# Patient Record
Sex: Male | Born: 1955 | Race: White | Hispanic: No | Marital: Married | Smoking: Former smoker
Health system: Southern US, Community
[De-identification: ages and names within clinical notes are randomized; demographics above are authoritative.]

## PROBLEM LIST (undated history)

## (undated) DIAGNOSIS — E785 Hyperlipidemia, unspecified: Secondary | ICD-10-CM

## (undated) DIAGNOSIS — Z87898 Personal history of other specified conditions: Secondary | ICD-10-CM

## (undated) DIAGNOSIS — M1990 Unspecified osteoarthritis, unspecified site: Secondary | ICD-10-CM

## (undated) DIAGNOSIS — I10 Essential (primary) hypertension: Secondary | ICD-10-CM

## (undated) DIAGNOSIS — I480 Paroxysmal atrial fibrillation: Secondary | ICD-10-CM

## (undated) DIAGNOSIS — C801 Malignant (primary) neoplasm, unspecified: Secondary | ICD-10-CM

## (undated) HISTORY — DX: Paroxysmal atrial fibrillation: I48.0

## (undated) HISTORY — PX: TOTAL KNEE ARTHROPLASTY: SHX125

## (undated) HISTORY — DX: Unspecified osteoarthritis, unspecified site: M19.90

## (undated) HISTORY — DX: Essential (primary) hypertension: I10

## (undated) HISTORY — DX: Personal history of other specified conditions: Z87.898

## (undated) HISTORY — DX: Hyperlipidemia, unspecified: E78.5

---

## 1994-02-20 DIAGNOSIS — I480 Paroxysmal atrial fibrillation: Secondary | ICD-10-CM

## 1994-02-20 HISTORY — DX: Paroxysmal atrial fibrillation: I48.0

## 1998-05-26 ENCOUNTER — Ambulatory Visit (HOSPITAL_COMMUNITY): Admission: RE | Admit: 1998-05-26 | Discharge: 1998-05-26 | Payer: Self-pay | Admitting: Family Medicine

## 1998-05-26 ENCOUNTER — Encounter: Payer: Self-pay | Admitting: Family Medicine

## 1998-06-09 ENCOUNTER — Emergency Department (HOSPITAL_COMMUNITY): Admission: EM | Admit: 1998-06-09 | Discharge: 1998-06-09 | Payer: Self-pay | Admitting: Emergency Medicine

## 1998-06-17 ENCOUNTER — Encounter: Payer: Self-pay | Admitting: Neurosurgery

## 1998-06-18 ENCOUNTER — Encounter: Payer: Self-pay | Admitting: Neurosurgery

## 1998-06-18 ENCOUNTER — Inpatient Hospital Stay (HOSPITAL_COMMUNITY): Admission: RE | Admit: 1998-06-18 | Discharge: 1998-06-19 | Payer: Self-pay | Admitting: Neurosurgery

## 1999-06-01 ENCOUNTER — Encounter: Payer: Self-pay | Admitting: Emergency Medicine

## 1999-06-01 ENCOUNTER — Emergency Department (HOSPITAL_COMMUNITY): Admission: EM | Admit: 1999-06-01 | Discharge: 1999-06-01 | Payer: Self-pay | Admitting: Emergency Medicine

## 2005-09-18 ENCOUNTER — Ambulatory Visit (HOSPITAL_COMMUNITY): Admission: RE | Admit: 2005-09-18 | Discharge: 2005-09-18 | Payer: Self-pay | Admitting: Orthopedic Surgery

## 2005-10-31 ENCOUNTER — Ambulatory Visit (HOSPITAL_COMMUNITY): Admission: RE | Admit: 2005-10-31 | Discharge: 2005-10-31 | Payer: Self-pay | Admitting: Orthopedic Surgery

## 2011-02-10 ENCOUNTER — Ambulatory Visit (INDEPENDENT_AMBULATORY_CARE_PROVIDER_SITE_OTHER): Payer: BC Managed Care – PPO

## 2011-02-10 DIAGNOSIS — J111 Influenza due to unidentified influenza virus with other respiratory manifestations: Secondary | ICD-10-CM

## 2011-02-10 DIAGNOSIS — M109 Gout, unspecified: Secondary | ICD-10-CM

## 2011-02-10 DIAGNOSIS — M25579 Pain in unspecified ankle and joints of unspecified foot: Secondary | ICD-10-CM

## 2012-05-01 ENCOUNTER — Ambulatory Visit: Payer: BC Managed Care – PPO

## 2012-05-01 ENCOUNTER — Ambulatory Visit (INDEPENDENT_AMBULATORY_CARE_PROVIDER_SITE_OTHER): Payer: BC Managed Care – PPO | Admitting: Family Medicine

## 2012-05-01 VITALS — BP 142/90 | HR 70 | Temp 98.1°F | Resp 18 | Ht 71.0 in | Wt 211.6 lb

## 2012-05-01 DIAGNOSIS — M25561 Pain in right knee: Secondary | ICD-10-CM

## 2012-05-01 DIAGNOSIS — I10 Essential (primary) hypertension: Secondary | ICD-10-CM | POA: Insufficient documentation

## 2012-05-01 DIAGNOSIS — M1710 Unilateral primary osteoarthritis, unspecified knee: Secondary | ICD-10-CM

## 2012-05-01 DIAGNOSIS — M25569 Pain in unspecified knee: Secondary | ICD-10-CM

## 2012-05-01 DIAGNOSIS — M1711 Unilateral primary osteoarthritis, right knee: Secondary | ICD-10-CM | POA: Insufficient documentation

## 2012-05-01 MED ORDER — OXYCODONE-ACETAMINOPHEN 5-325 MG PO TABS: 1.0000 | ORAL_TABLET | Freq: Three times a day (TID) | ORAL | Status: DC | PRN

## 2012-05-01 NOTE — Patient Instructions (Signed)
We are going to refer you to have another opinion about your knee.  In the meantime continue to apply ice and elevate the leg when you are more swollen.  You may use the percocet as needed, but remember it can cause sedation and be habit forming.

## 2012-05-01 NOTE — Progress Notes (Signed)
[facility] [address], [place] [address] [id number] [phone]  Date:  05/01/2012   Name:  Wayne Neal   DOB:  28-Jun-1955   MRN:  [id number]  PCP:  No primary provider on file.    Chief Complaint: Knee Pain   History of Present Illness:  Wayne Neal is a 57 y.o. very pleasant male patient who presents with the following:  He is here with knee pain today- his right knee has bothered him for about 5 years.  There was no particular injury, he has been told "there is no cartilage in the medial knee."  He has seen ortho and had injections of steroids 3 or 4 times- more recently this has not been very helpful.  His knee was last evaluated about 8- 12 months ago.  He had a knee scope several years ago as well.  He was on his knee a lot over the last week or so which made his knee worse.    Montee would like to pursue possible knee replacement- he has been told in the past he was too young for this procedure, but he is frustrated by his continued pain and would like to revisit this issue  He is "eating up" ibuprofen but it does not seem to help much.  He also has some tramadol at home but it "does not work worth a shit" so he does not take it.  Percocet has been helpful for him in the past  There is no problem list on file for this patient.   Past Medical History  Diagnosis Date  . Arthritis     History reviewed. No pertinent past surgical history.  History  Substance Use Topics  . Smoking status: Never Smoker   . Smokeless tobacco: Not on file  . Alcohol Use: .5 - 1 oz/week    1-2 drink(s) per week    History reviewed. No pertinent family history.  No Known Allergies  Medication list has been reviewed and updated.  No current outpatient prescriptions on file prior to visit.   No current facility-administered medications on file prior to visit.    Review of Systems:  As per HPI- otherwise negative.   Physical Examination: Filed Vitals:   05/01/12 0914  BP: 142/90  Pulse: 70  Temp: 98.1 F (36.7 C)  Resp: 18   Filed Vitals:   05/01/12 0914  Height: 5\' 11"  (1.803 m)  Weight: 211 lb 9.6 oz (95.981 kg)   Body mass index is 29.53 kg/(m^2). Ideal Body Weight: Weight in (lb) to have BMI = 25: 178.9  GEN: WDWN, NAD, Non-toxic, A & O x 3 HEENT: Atraumatic, Normocephalic. Neck supple. No masses, No LAD. Ears and Nose: No external deformity. CV: RRR, No M/G/R. No JVD. No thrill. No extra heart sounds. PULM: CTA B, no wheezes, crackles, rhonchi. No retractions. No resp. distress. No accessory muscle use.Marland Kitchen EXTR: No c/c/e NEURO stiff and favors right leg, gets better as he "warms up" with continued walking PSYCH: Normally interactive. Conversant. Not depressed or anxious appearing.  Calm demeanor.  right knee: swelling of the joint with a small effusion. Some medial laxity.  No heat, redness, or loss of flexion/ extension.  Knee is visibly swollen and appears consistent with chronic OA.  No suggestion of infection or gout  [facility] reading (PRIMARY) by  Dr. Patsy Lager. Right knee: moderate degenerative change, mostly sub- patellar.    Assessment and Plan: Pain in right knee - Plan: DG  Knee Complete 4 Views Right, oxyCODONE-acetaminophen (PERCOCET/ROXICET) 5-325 MG per tablet, Ambulatory referral to Orthopedic Surgery  Osteoarthritis of right knee - Plan: Ambulatory referral to Orthopedic Surgery  HTN (hypertension)  Persistent pain and OA in the right knee.  Did give him #30 percocet but warned about possible SE and dependence issues.  He has a knee sleeve that is somewhat helpful, and plans to get a cane.  Refer to ortho for a second opinion- he is aware that we cannot promise that a replacement will be appropriate for him, but he can certainly seek another opinion.  He will let us know if any problems in the meantime.    Abbe Amsterdam, MD

## 2012-06-17 ENCOUNTER — Telehealth: Payer: Self-pay

## 2012-06-17 NOTE — Telephone Encounter (Signed)
Patient is having issues with his insurance company getting his mri approved with insurance, he would like his pain medication refilled until her can get his mri approved if possible [phone]

## 2012-06-17 NOTE — Telephone Encounter (Signed)
Pt went to Dr. Daphine Deutscher at [facility]. He was sent for an MRI and is having trouble with his insurance covering it. Wants pain medication until he is able to have it done. Advised pt to call Dr. Ermalene Searing office for his treatment as they are handling his knee pain now. Pt understands.

## 2012-10-22 ENCOUNTER — Other Ambulatory Visit: Payer: Self-pay | Admitting: *Deleted

## 2012-10-22 MED ORDER — AMLODIPINE-OLMESARTAN 10-40 MG PO TABS: 1.0000 | ORAL_TABLET | Freq: Every day | ORAL | Status: DC

## 2012-10-22 NOTE — Telephone Encounter (Signed)
Rx was sent to pharmacy electronically. 

## 2012-10-29 ENCOUNTER — Other Ambulatory Visit: Payer: Self-pay | Admitting: Internal Medicine

## 2012-10-30 ENCOUNTER — Other Ambulatory Visit: Payer: Self-pay | Admitting: *Deleted

## 2012-10-30 ENCOUNTER — Ambulatory Visit (INDEPENDENT_AMBULATORY_CARE_PROVIDER_SITE_OTHER): Payer: BC Managed Care – PPO | Admitting: Internal Medicine

## 2012-10-30 ENCOUNTER — Encounter: Payer: Self-pay | Admitting: Internal Medicine

## 2012-10-30 VITALS — BP 160/90 | HR 94 | Ht 71.0 in | Wt 203.9 lb

## 2012-10-30 DIAGNOSIS — I480 Paroxysmal atrial fibrillation: Secondary | ICD-10-CM

## 2012-10-30 DIAGNOSIS — I4891 Unspecified atrial fibrillation: Secondary | ICD-10-CM

## 2012-10-30 DIAGNOSIS — E785 Hyperlipidemia, unspecified: Secondary | ICD-10-CM

## 2012-10-30 MED ORDER — METOPROLOL TARTRATE 25 MG PO TABS: 25.0000 mg | ORAL_TABLET | Freq: Every morning | ORAL | Status: DC

## 2012-10-30 MED ORDER — AMLODIPINE-OLMESARTAN 5-20 MG PO TABS: 1.0000 | ORAL_TABLET | Freq: Every day | ORAL | Status: DC

## 2012-10-30 NOTE — Patient Instructions (Addendum)
Your physician recommends that you return for lab work in 1-2 weeks. You will need to be fasting.  NMR with lipid  Your physician has recommended you make the following change in your medication: Take Azor 5-20 daily.  Your physician wants you to follow-up in: 1 year. You will receive a reminder letter in the mail two months in advance. If you don't receive a letter, please call our office to schedule the follow-up appointment.

## 2012-10-30 NOTE — Progress Notes (Signed)
OFFICE NOTE  Chief Complaint:  Routine office visit  Primary Care Physician: Abbe Amsterdam, MD  HPI:  Wayne Neal is a pleasant 57 year old gentleman with a history of one episode of atrial fibrillation in 1996. This spontaneously converted back to sinus and has not been on any anticoagulation since then. He's been on metoprolol which seems to keep him rate controlled and he has very infrequent palpitations. He has had problems with hypertension and has been taking Azor 10/40 mg and metoprolol. Recently he's had about 10 or 15 pounds of weight loss and after his surgery has had lower blood pressures. He attributes the lower blood pressures to weight loss however this may be do to the fact that he is no longer in chronic pain. Either way his blood pressures were lower recently in the 80s or 90s systolic. He has cut his Azor 10/40 in half and he feels less symptomatic with this. He denies any chest pain or shortness of breath with exertion. He does not have an active primary care provider has not had a test of his cholesterol in some time.  He recently had right total knee replacement at [facility] and is recovering from that.  PMHx:  Past Medical History  Diagnosis Date  . Arthritis   . Hypertension   . History of palpitations   . PAF (paroxysmal atrial fibrillation) 1996  . Hyperlipidemia     Past Surgical History  Procedure Laterality Date  . Total knee arthroplasty      right - 08/2012    FAMHx:  Family History  Problem Relation Age of Onset  . Hyperlipidemia Mother   . Heart disease Father     CABG  . Hyperlipidemia Sister     SOCHx:   reports that he quit smoking about 30 years ago. His smokeless tobacco use includes Snuff. He reports that he drinks about 3.5 ounces of alcohol per week. He reports that he does not use illicit drugs.  ALLERGIES:  Allergies  Allergen Reactions  . Vytorin [Ezetimibe-Simvastatin]     myalgias    ROS: A comprehensive  review of systems was negative except for: Musculoskeletal: positive for right TKR  HOME MEDS: Current Outpatient Prescriptions  Medication Sig Dispense Refill  . COLCRYS 0.6 MG tablet Take 0.6 mg by mouth daily as needed.      Marland Kitchen HYDROcodone-acetaminophen (NORCO) 10-325 MG per tablet as needed.      . metoprolol tartrate (LOPRESSOR) 25 MG tablet Take 1 tablet (25 mg total) by mouth every morning.  90 tablet  3  . amLODipine-olmesartan (AZOR) 5-20 MG per tablet Take 1 tablet by mouth daily.  90 tablet  3   No current facility-administered medications for this visit.    LABS/IMAGING: No results found for this or any previous visit (from the past 48 hour(s)). No results found.  VITALS: BP 160/90  Pulse 94  Ht 5\' 11"  (1.803 m)  Wt 203 lb 14.4 oz (92.488 kg)  BMI 28.45 kg/m2  EXAM: General appearance: alert and no distress Neck: no adenopathy, no carotid bruit, no JVD, supple, symmetrical, trachea midline and thyroid not enlarged, symmetric, no tenderness/mass/nodules Lungs: clear to auscultation bilaterally Heart: regular rate and rhythm, S1, S2 normal, no murmur, click, rub or gallop Abdomen: soft, non-tender; bowel sounds normal; no masses,  no organomegaly Extremities: extremities normal, atraumatic, no cyanosis or edema, the right knee is slightly warm, healing midline incision, mild effusion Pulses: 2+ and symmetric Skin: Skin color, texture, turgor normal. No  rashes or lesions Neurologic: Grossly normal  EKG: Sinus rhythm at 94  ASSESSMENT: 1. Hypertension-with recent episodes of hypotension 2. Remote solitary atrial fibrillation episode 3. Dyslipidemia-intolerant to statins, taking Cholestoff  PLAN: 1.   Mr. Archila is doing well after his right total knee replacement. He tolerated surgery without any difficulty. He's really had lower blood pressures, and this may be do to improvement in his pain. It may also have to do with some weight loss. I agree with decreasing his  Azor to 5/20 mg daily and have given him a new prescription for this. His blood pressure slightly higher today because he ran out of his Lopressor and we'll go ahead and refill that at the same dose. Finally, he is due for a recheck of his lipid profile. He's been intolerant to Crestor, Vytorin, and pravastatin in the past. He's currently taking Cholestoff OTC. Plan repeat office visit in one year.  Chrystie Nose, MD, [facility] Attending Cardiologist [facility]  Graison Leinberger C 10/30/2012, 8:47 AM

## 2012-10-31 LAB — NMR LIPOPROFILE WITH LIPIDS
HDL Particle Number: 36.9 umol/L (ref >=30.5)
HDL-C: 54 mg/dL (ref >=40)
LDL Size: 20.3 nm — ABNORMAL LOW (ref >20.5)
Large HDL-P: 6.1 umol/L (ref >=4.8)
Large VLDL-P: 4.7 nmol/L — ABNORMAL HIGH (ref <=2.7)

## 2012-11-07 ENCOUNTER — Telehealth: Payer: Self-pay | Admitting: *Deleted

## 2012-11-07 NOTE — Telephone Encounter (Signed)
Left VM for patient to call about lab results

## 2012-11-07 NOTE — Telephone Encounter (Signed)
Message copied by Lindell Spar on Thu Nov 07, 2012  3:00 PM ------      Message from: Chrystie Nose      Created: Mon Nov 04, 2012  8:59 AM       Please notify patient that his cholesterol is too high.  I'm not sure his over the counter medication is helping much. Would he consider a trial of another statin medication? Perhaps atorvastatin 20 mg QHS? I know he has been intolerant to other statins in the past.            -Dr. Rennis Golden       ------

## 2012-11-11 ENCOUNTER — Encounter: Payer: Self-pay | Admitting: *Deleted

## 2012-11-14 ENCOUNTER — Encounter: Payer: Self-pay | Admitting: *Deleted

## 2012-12-02 ENCOUNTER — Telehealth: Payer: Self-pay | Admitting: Internal Medicine

## 2012-12-02 MED ORDER — ATORVASTATIN CALCIUM 20 MG PO TABS: 20.0000 mg | ORAL_TABLET | Freq: Every day | ORAL | Status: DC

## 2012-12-02 NOTE — Telephone Encounter (Signed)
Forwarded to jenna 

## 2012-12-02 NOTE — Telephone Encounter (Signed)
Rx was sent to pharmacy electronically. 

## 2012-12-02 NOTE — Telephone Encounter (Signed)
Received a letter from [place] saying that we had sent in his prescription for Atorvastatin 20 mg had been send to [facility] on [address]. He just left there,it was not there.

## 2012-12-02 NOTE — Telephone Encounter (Signed)
Medication sent to pharmacy  

## 2012-12-12 ENCOUNTER — Ambulatory Visit (INDEPENDENT_AMBULATORY_CARE_PROVIDER_SITE_OTHER): Payer: BC Managed Care – PPO | Admitting: Family Medicine

## 2012-12-12 VITALS — BP 142/90 | HR 82 | Temp 98.2°F | Resp 16 | Ht 71.5 in | Wt 211.2 lb

## 2012-12-12 DIAGNOSIS — M7020 Olecranon bursitis, unspecified elbow: Secondary | ICD-10-CM

## 2012-12-12 DIAGNOSIS — M7022 Olecranon bursitis, left elbow: Secondary | ICD-10-CM

## 2012-12-12 DIAGNOSIS — M109 Gout, unspecified: Secondary | ICD-10-CM

## 2012-12-12 LAB — POCT CBC
Lymph, poc: 2 (ref 0.6–3.4)
MCH, POC: 28.7 pg (ref 27–31.2)
MCHC: 30.6 g/dL — AB (ref 31.8–35.4)
MID (cbc): 0.6 (ref 0–0.9)
MPV: 9.7 fL (ref 0–99.8)
POC MID %: 6.8 %M (ref 0–12)
Platelet Count, POC: 208 10*3/uL (ref 142–424)
WBC: 9.4 10*3/uL (ref 4.6–10.2)

## 2012-12-12 LAB — SYNOVIAL CELL COUNT + DIFF, W/ CRYSTALS
Crystals, Fluid: NONE SEEN
Eosinophils-Synovial: 0 % (ref 0–1)
Lymphocytes-Synovial Fld: 54 % — ABNORMAL HIGH (ref 0–20)
Monocyte/Macrophage: 12 % — ABNORMAL LOW (ref 50–90)

## 2012-12-12 MED ORDER — COLCHICINE 0.6 MG PO TABS: ORAL_TABLET | ORAL | Status: DC

## 2012-12-12 MED ORDER — HYDROCODONE-ACETAMINOPHEN 10-325 MG PO TABS: 1.0000 | ORAL_TABLET | ORAL | Status: DC | PRN

## 2012-12-12 NOTE — Patient Instructions (Signed)
Wrap the elbow so that it is padded and the fluid does not re-accumulate.  Try to avoid direct pressure to the elbow or blows to the elbow.  Ice frequently - 20 minutes 4 times a day for the next week.  Olecranon Bursitis  with Rehab A bursa is a fluid filled sac that is located between soft tissues (ligaments, tendons, skin) and bones. The purpose of a bursa is to allow the soft tissue to function smoothly, without friction. The olecrenon bursa is located between the back of the elbow (olecrenon) and the skin. Olecrenon bursitis involves inflammation of this bursa, resulting in pain. SYMPTOMS   Pain, tenderness, swelling, warmth, or redness over the back of the elbow.  Reduced range of motion of the affected elbow.  Sometimes, severe pain with movement of the affected elbow.  Crackling sound (crepitation) when the bursa is moved or touched.  Often, painless swelling of the bursa.  Fever (when infected). CAUSES  Olecranon bursitis is often caused by direct hit (trauma) to the elbow. Less commonly, it is due to overuse and/or strenuous exercise that the elbow is not used to. RISK INCREASES WITH:  Sports that require bending or landing on the elbow (football, volleyball).  Vigorous or repetitive athletic training, or sudden increase or change in activity level (weekend warriors).  Failure to warm up properly before activity.  Poor exercise technique.  Playing on artificial turf. PREVENTION  Avoid injuries and the overuse of muscles whenever possible.  Warm up and stretch properly before activity.  Allow for adequate recovery between workouts.  Maintain physical fitness:  Strength, flexibility, and endurance.  Cardiovascular fitness.  Learn and use proper technique.  Wear properly fitted and padded protective equipment. PROGNOSIS  If treated properly, olecranon bursitis is usually curable within 2 weeks.  RELATED COMPLICATIONS   Longer healing time, if not properly  treated or if not given enough time to heal.  Recurring symptoms that result in a chronic problem.  Joint stiffness with permanent limitation of the affected joint's movement.  Infection of the bursa.  Chronic inflammation or scarring of the bursa. TREATMENT Treatment first involves the use of ice and medicine, to reduce pain and inflammation. The use of strengthening and stretching exercises may help reduce pain with activity. These exercises may be performed at home or with a therapist. Elbow pads may be advised, to protect the bursa. If symptoms persist, despite non-surgical treatment, a procedure to withdraw fluid from the bursa may be advised. This procedure may be accompanied with an injection of corticosteroids, to reduce inflammation. Sometimes, surgery is needed to remove the bursa. MEDICATION  If pain medicine is needed, nonsteroidal anti-inflammatory medicines (aspirin and ibuprofen), or other minor pain relievers (acetaminophen), are often advised.  Do not take pain medicine for 7 days before surgery.  Prescription pain relievers may be given, if your caregiver thinks they are needed. Use only as directed and only as much as you need.  Corticosteroid injections may be given by your caregiver. These injections should be reserved for the most serious cases, because they may only be given a certain number of times. HEAT AND COLD  Cold treatment (icing) should be applied for 10 to 15 minutes every 2 to 3 hours for inflammation and pain, and immediately after activity that aggravates your symptoms. Use ice packs or an ice massage.  Heat treatment may be used before performing stretching and strengthening activities prescribed by your caregiver, physical therapist, or athletic trainer. Use a heat pack or  a warm water soak. SEEK IMMEDIATE MEDICAL CARE IF:   Symptoms get worse or do not improve in 2 weeks, despite treatment.  Signs of infection develop, including fever of 102 F  (38.9 C), increased pain, redness, warmth, or pus draining from the bursa.  New, unexplained symptoms develop. (Drugs used in treatment may produce side effects.) EXERCISES  RANGE OF MOTION (ROM) AND STRETCHING EXERCISES - Olecranon Bursitis These exercises may help you when beginning to rehabilitate your injury. Your symptoms may resolve with or without further involvement from your physician, physical therapist or athletic trainer. While completing these exercises, remember:   Restoring tissue flexibility helps normal motion to return to the joints. This allows healthier, less painful movement and activity.  An effective stretch should be held for at least 30 seconds.  A stretch should never be painful. You should only feel a gentle lengthening or release in the stretched tissue. RANGE OF MOTION  Elbow Flexion, Supine  Lie on your back. Extend your right / left arm into the air, bracing it with your opposite hand. Allow your right / left arm to relax.  Let your elbow bend, allowing your hand to fall slowly toward your chest.  You should feel a gentle stretch along the back of your upper arm and elbow. Your physician, physical therapist or athletic trainer may ask you to hold a __________ hand weight to increase the intensity of this stretch.  Hold for __________ seconds. Slowly return your right / left arm to the upright position. Repeat __________ times. Complete this exercise __________ times per day. STRETCH  Elbow Flexors  Lie on a firm bed or countertop on your back. Be sure that you are in a comfortable position which will allow you to relax your arm muscles.  Place a folded towel under your right / left upper arm, so that your elbow and shoulder are at the same height. Extend your arm; your elbow should not rest on the bed or towel  Allow the weight of your hand to straighten your elbow. Keep your arm and chest muscles relaxed. Your caregiver may ask you to increase the  intensity of your stretch by adding a small wrist or hand weight.  Hold for __________ seconds. You should feel a stretch on the inside of your elbow. Slowly return to the starting position. Repeat __________ times. Complete this exercise __________ times per day. STRENGTHENING EXERCISES - Olecranon Bursitis These exercises will help you regain your strength. They may resolve your symptoms with or without further involvement from your physician, physical therapist or athletic trainer. While completing these exercises, remember:   Muscles can gain both the endurance and the strength needed for everyday activities through controlled exercises.  Complete these exercises as instructed by your physician, physical therapist or athletic trainer. Increase the resistance and repetitions only as guided by your caregiver.  You may experience muscle soreness or fatigue, but the pain or discomfort you are trying to eliminate should never worsen during these exercises. If this pain does worsen, stop and make certain you are following the directions exactly. If the pain is still present after adjustments, discontinue the exercise until you can discuss the trouble with your caregiver. STRENGTH - Elbow Extensors, Isometric  Stand or sit upright on a firm surface. Place your right / left arm so that your palm faces your stomach, and it is at the height of your waist.  Place your opposite hand on the underside of your forearm. Gently push up as  your right / left arm resists. Push as hard as you can with both arms without causing any pain or movement at your right / left elbow. Hold this stationary position for __________ seconds.  Gradually release the tension in both arms. Allow your muscles to relax completely before repeating. Repeat __________ times. Complete this exercise __________ times per day. STRENGTH - Elbow Flexors, Isometric  Stand or sit upright on a firm surface. Place your right / left arm so that  your hand is palm-up and at the height of your waist.  Place your opposite hand on top of your forearm. Gently push down as your right / left arm resists. Push as hard as you can with both arms without causing any pain or movement at your right / left elbow. Hold this stationary position for __________ seconds.  Gradually release the tension in both arms. Allow your muscles to relax completely before repeating. Repeat __________ times. Complete this exercise __________ times per day. STRENGTH  Elbow Flexors, Supinated  With good posture, stand or sit on a firm chair without armrests. Allow your right / left arm to rest at your side with your palm facing forward.  Holding a __________ weight, or gripping a rubber exercise band or tubing,  bring your hand toward your shoulder.  Allow your muscles to control the resistance as your hand returns to your side. Repeat __________ times. Complete this exercise __________ times per day.  STRENGTH  Elbow Flexors, Neutral  With good posture, stand or sit on a firm chair without armrests. Allow your right / left arm to rest at your side with your thumb facing forward.  Holding a __________weight, or gripping a rubber exercise band or tubing,  bring your hand toward your shoulder.  Allow your muscles to control the resistance as your hand returns to your side. Repeat __________ times. Complete this exercise __________ times per day.  STRENGTH  Elbow Extensors  Lie on your back. Extend your right / left elbow into the air, pointing it toward the ceiling. Brace your arm with your opposite hand.*  Holding a __________ weight in your hand, slowly straighten your right / left elbow.  Allow your muscles to control the weight as your hand returns to its starting position. Repeat __________ times. Complete this exercise __________ times per day. *You may also stand with your elbow overhead and pointed toward the ceiling, supported by your opposite  hand. STRENGTH - Elbow Extensors, Dynamic  With good posture, stand, or sit on a firm chair without armrests. Keeping your upper arms at your side, bring both hands up to your right / left shoulder while gripping a rubber exercise band or tubing. Your right / left hand should be just below the other hand.  Straighten your right / left elbow. Hold for __________ seconds.  Allow your muscles to control the rubber exercise band, as your hand returns to your shoulder. Repeat __________ times. Complete this exercise __________ times per day. Document Released: 02/06/2005 Document Revised: 05/01/2011 Document Reviewed: 05/21/2008 [facility] Patient Information 2014 [place], [place].  Gout Gout is an inflammatory condition (arthritis) caused by a buildup of uric acid crystals in the joints. Uric acid is a chemical that is normally present in the blood. Under some circumstances, uric acid can form into crystals in your joints. This causes joint redness, soreness, and swelling (inflammation). Repeat attacks are common. Over time, uric acid crystals can form into masses (tophi) near a joint, causing disfigurement. Gout is treatable and often  preventable. CAUSES  The disease begins with elevated levels of uric acid in the blood. Uric acid is produced by your body when it breaks down a naturally found substance called purines. This also happens when you eat certain foods such as meats and fish. Causes of an elevated uric acid level include:  Being passed down from parent to child (heredity).  Diseases that cause increased uric acid production (obesity, psoriasis, some cancers).  Excessive alcohol use.  Diet, especially diets rich in meat and seafood.  Medicines, including certain cancer-fighting drugs (chemotherapy), diuretics, and aspirin.  Chronic kidney disease. The kidneys are no longer able to remove uric acid well.  Problems with metabolism. Conditions strongly associated with gout  include:  Obesity.  High blood pressure.  High cholesterol.  Diabetes. Not everyone with elevated uric acid levels gets gout. It is not understood why some people get gout and others do not. Surgery, joint injury, and eating too much of certain foods are some of the factors that can lead to gout. SYMPTOMS   An attack of gout comes on quickly. It causes intense pain with redness, swelling, and warmth in a joint.  Fever can occur.  Often, only one joint is involved. Certain joints are more commonly involved:  Base of the big toe.  Knee.  Ankle.  Wrist.  Finger. Without treatment, an attack usually goes away in a few days to weeks. Between attacks, you usually will not have symptoms, which is different from many other forms of arthritis. DIAGNOSIS  Your caregiver will suspect gout based on your symptoms and exam. Removal of fluid from the joint (arthrocentesis) is done to check for uric acid crystals. Your caregiver will give you a medicine that numbs the area (local anesthetic) and use a needle to remove joint fluid for exam. Gout is confirmed when uric acid crystals are seen in joint fluid, using a special microscope. Sometimes, blood, urine, and X-ray tests are also used. TREATMENT  There are 2 phases to gout treatment: treating the sudden onset (acute) attack and preventing attacks (prophylaxis). Treatment of an Acute Attack  Medicines are used. These include anti-inflammatory medicines or steroid medicines.  An injection of steroid medicine into the affected joint is sometimes necessary.  The painful joint is rested. Movement can worsen the arthritis.  You may use warm or cold treatments on painful joints, depending which works best for you.  Discuss the use of coffee, vitamin C, or cherries with your caregiver. These may be helpful treatment options. Treatment to Prevent Attacks After the acute attack subsides, your caregiver may advise prophylactic medicine. These  medicines either help your kidneys eliminate uric acid from your body or decrease your uric acid production. You may need to stay on these medicines for a very long time. The early phase of treatment with prophylactic medicine can be associated with an increase in acute gout attacks. For this reason, during the first few months of treatment, your caregiver may also advise you to take medicines usually used for acute gout treatment. Be sure you understand your caregiver's directions. You should also discuss dietary treatment with your caregiver. Certain foods such as meats and fish can increase uric acid levels. Other foods such as dairy can decrease levels. Your caregiver can give you a list of foods to avoid. HOME CARE INSTRUCTIONS   Do not take aspirin to relieve pain. This raises uric acid levels.  Only take over-the-counter or prescription medicines for pain, discomfort, or fever as directed by your  caregiver.  Rest the joint as much as possible. When in bed, keep sheets and blankets off painful areas.  Keep the affected joint raised (elevated).  Use crutches if the painful joint is in your leg.  Drink enough water and fluids to keep your urine clear or pale yellow. This helps your body get rid of uric acid. Do not drink alcoholic beverages. They slow the passage of uric acid.  Follow your caregiver's dietary instructions. Pay careful attention to the amount of protein you eat. Your daily diet should emphasize fruits, vegetables, whole grains, and fat-free or low-fat milk products.  Maintain a healthy body weight. SEEK MEDICAL CARE IF:   You have an oral temperature above 102 F (38.9 C).  You develop diarrhea, vomiting, or any side effects from medicines.  You do not feel better in 24 hours, or you are getting worse. SEEK IMMEDIATE MEDICAL CARE IF:   Your joint becomes suddenly more tender and you have:  Chills.  An oral temperature above 102 F (38.9 C), not controlled by  medicine. MAKE SURE YOU:   Understand these instructions.  Will watch your condition.  Will get help right away if you are not doing well or get worse. Document Released: 02/04/2000 Document Revised: 05/01/2011 Document Reviewed: 05/17/2009 [facility] Patient Information 2014 [place], [place].

## 2012-12-12 NOTE — Progress Notes (Signed)
Subjective:    Patient ID: Wayne Neal, male    DOB: 04/12/1955, 57 y.o.   MRN: [id number]  This chart was scribed for Norberto Sorenson, MD by Danella Maiers, ED Scribe. This patient was seen in room 2 and the patient's care was started at 8:33 AM.  Chief Complaint  Patient presents with  . Elbow Pain    left with swelling   HPI HPI Comments: Wayne Neal is a 57 y.o. male who presents to the [facility] complaining of gout in the left elbow. He woke up yesterday with a bump and soreness and as the day went on it got stiffer and stiffer until he could no longer raise his arm. He took two hydrocodones and four Colcrys with no relief. He has a h/o gout in his feet. He states he can feel his heartbeat in it. He had a hamburger yesterday but only eats red meat a couple times a week. He is taking Colcrys only when he has a gout flare. He has a gout flare once per month. He is not on any preventative medication.   Past Medical History  Diagnosis Date  . Arthritis   . Hypertension   . History of palpitations   . PAF (paroxysmal atrial fibrillation) 1996  . Hyperlipidemia    Current Outpatient Prescriptions on File Prior to Visit  Medication Sig Dispense Refill  . amLODipine-olmesartan (AZOR) 5-20 MG per tablet Take 1 tablet by mouth daily.  90 tablet  3  . atorvastatin (LIPITOR) 20 MG tablet Take 1 tablet (20 mg total) by mouth daily.  30 tablet  11  . metoprolol tartrate (LOPRESSOR) 25 MG tablet Take 1 tablet (25 mg total) by mouth every morning.  90 tablet  3   No current facility-administered medications on file prior to visit.   Allergies  Allergen Reactions  . Vytorin [Ezetimibe-Simvastatin]     myalgias     Review of Systems  Constitutional: Negative for fever and chills.  HENT: Negative for sore throat.   Respiratory: Negative for cough.   Cardiovascular: Negative for chest pain.  Gastrointestinal: Negative for nausea, vomiting and diarrhea.   Genitourinary: Negative for dysuria.  Musculoskeletal: Positive for joint swelling (left elbow). Negative for back pain and neck pain.  Skin: Negative for rash.  Neurological: Negative for headaches.  Psychiatric/Behavioral: Negative for confusion.      BP 142/90  Pulse 82  Temp(Src) 98.2 F (36.8 C) (Oral)  Resp 16  Ht 5' 11.5" (1.816 m)  Wt 211 lb 3.2 oz (95.8 kg)  BMI 29.05 kg/m2  SpO2 98% Objective:   Physical Exam  Nursing note and vitals reviewed. Constitutional: He is oriented to person, place, and time. He appears well-developed and well-nourished. No distress.  HENT:  Head: Normocephalic and atraumatic.  Eyes: EOM are normal.  Neck: Neck supple. No tracheal deviation present.  Cardiovascular: Normal rate and regular rhythm.   Pulmonary/Chest: Effort normal. No respiratory distress.  Musculoskeletal: Normal range of motion.  Left elbow with significantly decreased ROM, large amt of swelling confined to olecranon bursa, no erythema or warmth but extremely hypersensitive.  Neurological: He is alert and oriented to person, place, and time.  Skin: Skin is warm and dry.  Psychiatric: He has a normal mood and affect. His behavior is normal.   Risks/benefits of aspiration and injection reviewed and informed consent obtained.  Left elbow positioned at 90 deg flexion, cleaned with betadine x 2.  Anesthesia w/ ethyl chloride  cold spray. Aspirated 5cc of clear yellow joint fluid in lateral approach to olecranon bursa and injected with 20mg  of DepoMedrol and 2cc of 1% lidocaine using 21g 1 1/2in needle without complications. Pt tolerated procedure well. No EBL.    Assessment & Plan:    Gout - Plan: POCT CBC, POCT SEDIMENTATION RATE, Uric acid, Synovial cell count + diff, w/ crystals  Olecranon bursitis of left elbow - Plan: POCT CBC, POCT SEDIMENTATION RATE, Uric acid, Synovial cell count + diff, w/ crystals  Meds ordered this encounter  Medications  . DISCONTD:  HYDROcodone-acetaminophen (NORCO) 10-325 MG per tablet    Sig: Take 1 tablet by mouth every 4 (four) hours as needed for pain.    Dispense:  30 tablet    Refill:  0  . colchicine (COLCRYS) 0.6 MG tablet    Sig: Take 2 tabs po x 1 at onset of gout flair then take 1 tab 1 hr later    Dispense:  6 tablet    Refill:  11    I personally performed the services described in this documentation, which was scribed in my presence. The recorded information has been reviewed and considered, and addended by me as needed.  Norberto Sorenson, MD MPH

## 2012-12-24 ENCOUNTER — Telehealth: Payer: Self-pay

## 2012-12-24 NOTE — Telephone Encounter (Signed)
No, this is a medication used for acute injuries only so needs OV. If he feels he needs it chronically due to gout flaires, needs to discuss this with his PCP in an OV (preferably scheduled) so he can get his gout under better control and cons pain management referral.

## 2012-12-24 NOTE — Telephone Encounter (Signed)
Pt requesting hydrochondone refill  Best phone for pt is [phone]

## 2012-12-24 NOTE — Telephone Encounter (Signed)
Called him to advise.  

## 2012-12-24 NOTE — Telephone Encounter (Signed)
Pended please advise.  

## 2012-12-27 ENCOUNTER — Ambulatory Visit (INDEPENDENT_AMBULATORY_CARE_PROVIDER_SITE_OTHER): Payer: BC Managed Care – PPO | Admitting: Physician Assistant

## 2012-12-27 VITALS — BP 132/76 | HR 90 | Temp 98.0°F | Resp 16 | Ht 71.5 in | Wt 211.0 lb

## 2012-12-27 DIAGNOSIS — M109 Gout, unspecified: Secondary | ICD-10-CM

## 2012-12-27 MED ORDER — HYDROCODONE-ACETAMINOPHEN 10-325 MG PO TABS: 1.0000 | ORAL_TABLET | ORAL | Status: DC | PRN

## 2012-12-27 MED ORDER — ALLOPURINOL 100 MG PO TABS: 100.0000 mg | ORAL_TABLET | Freq: Every day | ORAL | Status: DC

## 2012-12-27 NOTE — Progress Notes (Signed)
Subjective:    Patient ID: Wayne Neal, male    DOB: 1955/09/22, 57 y.o.   MRN: [id number]  HPI This 57 y.o. male presents for evaluation of gout.  He was seen last month with similar symptoms-pain and swelling in the LEFT elbow consistent with his previous episodes of gout.  He received a refill of colcrys and hydrocodone with initial good relief, but then his symptoms recurred several days ago, and he has run out of meds.  In addition to wanting refills, he's interested in medication to take daily to prevent flares.  Recent notes and labs reviewed.  Uric acid was at the upper limit of normal.  No crystals in fluid aspirated from the elbow.  Prior to Admission medications   Medication Sig Start Date End Date Taking? Authorizing Provider  amLODipine-olmesartan (AZOR) 5-20 MG per tablet Take 1 tablet by mouth daily. 10/30/12  Yes Chrystie Nose, MD  atorvastatin (LIPITOR) 20 MG tablet Take 1 tablet (20 mg total) by mouth daily. 12/02/12  Yes Chrystie Nose, MD  metoprolol tartrate (LOPRESSOR) 25 MG tablet Take 1 tablet (25 mg total) by mouth every morning. 10/30/12  Yes Chrystie Nose, MD  allopurinol (ZYLOPRIM) 100 MG tablet Take 1 tablet (100 mg total) by mouth daily. Start after resolution of acute flare of gout. 12/27/12   Judythe Postema Tessa Lerner, PA-C  colchicine (COLCRYS) 0.6 MG tablet Take 2 tabs po x 1 at onset of gout flair then take 1 tab 1 hr later 12/12/12   Sherren Mocha, MD  HYDROcodone-acetaminophen [facility]) 10-325 MG per tablet Take 1 tablet by mouth every 4 (four) hours as needed. 12/27/12   Fernande Bras, PA-C   Medications, allergies, past medical history, surgical history, family history, social history and problem list reviewed.     Review of Systems As above.    Objective:   Physical Exam  Vitals reviewed. Constitutional: He is oriented to person, place, and time. Vital signs are normal. He appears well-developed and well-nourished. He is active and cooperative. No  distress.  HENT:  Head: Normocephalic and atraumatic.  Right Ear: Hearing normal.  Left Ear: Hearing normal.  Eyes: EOM are normal. Pupils are equal, round, and reactive to light.  Neck: Normal range of motion. Neck supple. No thyromegaly present.  Cardiovascular: Normal rate, regular rhythm and normal heart sounds.   Pulses:      Radial pulses are 2+ on the right side, and 2+ on the left side.  Pulmonary/Chest: Effort normal and breath sounds normal.  Musculoskeletal:       Left elbow: He exhibits swelling (olecranon area-bony, not soft). He exhibits normal range of motion, no effusion and no laceration. Tenderness found. Olecranon process tenderness noted. No radial head, no medial epicondyle and no lateral epicondyle tenderness noted.       Left wrist: Normal.       Right upper arm: Normal.       Right forearm: Normal.       Right hand: Normal.  Lymphadenopathy:       Head (right side): No tonsillar, no preauricular, no posterior auricular and no occipital adenopathy present.       Head (left side): No tonsillar, no preauricular, no posterior auricular and no occipital adenopathy present.    He has no cervical adenopathy.       Right: No supraclavicular adenopathy present.       Left: No supraclavicular adenopathy present.  Neurological: He is alert and oriented to  person, place, and time. No sensory deficit.  Skin: Skin is warm, dry and intact. No rash noted. No cyanosis or erythema. Nails show no clubbing.  Psychiatric: He has a normal mood and affect.          Assessment & Plan:  Gout - Plan: he has 11 refills of colcrys at the pharmacy he was unaware of, HYDROcodone-acetaminophen (NORCO) 10-325 MG per tablet; once acute symptoms are resolved, start allopurinol (ZYLOPRIM) 100 MG tablet. Supportive care.  Anticipatory guidance.  RTC if symptoms worsen/persist.  Fernande Bras, PA-C Physician Assistant-Certified [facility]

## 2012-12-27 NOTE — Patient Instructions (Addendum)
Take the Colcrys and hydrocodone as before (there are 11 refills of the Colcrys on file for you at the pharmacy). Once this flare has calmed down, start the Allopurinol, one tablet each day. The Allopurinol dose can be increased if the initial dose doesn't adequately reduce your gout flares. Be sure to drink plenty of liquids, as dehydration, even mild dehydration, can trigger a flare.

## 2013-02-25 ENCOUNTER — Ambulatory Visit (INDEPENDENT_AMBULATORY_CARE_PROVIDER_SITE_OTHER): Payer: BC Managed Care – PPO | Admitting: Emergency Medicine

## 2013-02-25 VITALS — BP 142/82 | HR 80 | Temp 98.3°F | Resp 16 | Ht 71.5 in | Wt 212.0 lb

## 2013-02-25 DIAGNOSIS — M109 Gout, unspecified: Secondary | ICD-10-CM

## 2013-02-25 DIAGNOSIS — I10 Essential (primary) hypertension: Secondary | ICD-10-CM

## 2013-02-25 MED ORDER — COLCHICINE 0.6 MG PO TABS: ORAL_TABLET | ORAL | Status: DC

## 2013-02-25 MED ORDER — HYDROCODONE-ACETAMINOPHEN 10-325 MG PO TABS: 1.0000 | ORAL_TABLET | ORAL | Status: DC | PRN

## 2013-02-25 MED ORDER — ALLOPURINOL 100 MG PO TABS: 200.0000 mg | ORAL_TABLET | Freq: Every day | ORAL | Status: DC

## 2013-02-25 NOTE — Patient Instructions (Signed)

## 2013-02-25 NOTE — Progress Notes (Signed)
[facility] [address], [place] [address] [phone]  Date:  02/25/2013   Name:  Wayne Neal   DOB:  [birth year]   MRN:  [id number]  PCP:  Lamar Blinks, MD    Chief Complaint: Gout   History of Present Illness:  Wayne Neal is a 58 y.o. very pleasant male patient who presents with the following:  History of recurrent gout.  Last given 60 vicodin that he has husbanded for the past 90 days.  Has outbreak of gout now.  Consistent with his normal pattern.   Denies other complaint or health concern today.   Patient Active Problem List   Diagnosis Date Noted  . Gout 12/27/2012  . Dyslipidemia 10/30/2012  . PAF (paroxysmal atrial fibrillation) 10/30/2012  . HTN (hypertension) 05/01/2012  . Osteoarthritis of right knee 05/01/2012    Past Medical History  Diagnosis Date  . Arthritis   . Hypertension   . History of palpitations   . PAF (paroxysmal atrial fibrillation) 1996  . Hyperlipidemia     Past Surgical History  Procedure Laterality Date  . Total knee arthroplasty      right - 08/2012    History  Substance Use Topics  . Smoking status: Former Smoker    Quit date: 10/31/1982  . Smokeless tobacco: Current User    Types: Snuff  . Alcohol Use: 3.5 oz/week    7 drink(s) per week    Family History  Problem Relation Age of Onset  . Hyperlipidemia Mother   . Heart disease Father     CABG  . Hyperlipidemia Sister     Allergies  Allergen Reactions  . Vytorin [Ezetimibe-Simvastatin]     myalgias    Medication list has been reviewed and updated.  Current Outpatient Prescriptions on File Prior to Visit  Medication Sig Dispense Refill  . allopurinol (ZYLOPRIM) 100 MG tablet Take 1 tablet (100 mg total) by mouth daily. Start after resolution of acute flare of gout.  30 tablet  5  . amLODipine-olmesartan (AZOR) 5-20 MG per tablet Take 1 tablet by mouth daily.  90 tablet  3  . atorvastatin (LIPITOR) 20 MG tablet Take 1 tablet (20  mg total) by mouth daily.  30 tablet  11  . colchicine (COLCRYS) 0.6 MG tablet Take 2 tabs po x 1 at onset of gout flair then take 1 tab 1 hr later  6 tablet  11  . metoprolol tartrate (LOPRESSOR) 25 MG tablet Take 1 tablet (25 mg total) by mouth every morning.  90 tablet  3  . HYDROcodone-acetaminophen (NORCO) 10-325 MG per tablet Take 1 tablet by mouth every 4 (four) hours as needed.  60 tablet  0   No current facility-administered medications on file prior to visit.    Review of Systems:  As per HPI, otherwise negative.    Physical Examination: Filed Vitals:   02/25/13 0850  BP: 142/82  Pulse: 80  Temp: 98.3 F (36.8 C)  Resp: 16   Filed Vitals:   02/25/13 0850  Height: 5' 11.5" (1.816 m)  Weight: 212 lb (96.163 kg)   Body mass index is 29.16 kg/(m^2). Ideal Body Weight: Weight in (lb) to have BMI = 25: 181.4   GEN: WDWN, NAD, Non-toxic, Alert & Oriented x 3 HEENT: Atraumatic, Normocephalic.  Ears and Nose: No external deformity. EXTR: No clubbing/cyanosis/edema NEURO: Normal gait.  PSYCH: Normally interactive. Conversant. Not depressed or anxious appearing.  Calm demeanor.  LEFT elbow:  Olecranon  bursitis Left great toe:  Swollen and red MTP  Assessment and Plan: Acute gout exacerbation Refill meds Increase allopurinol to 200 daily  Signed,  Ellison Carwin, MD

## 2013-03-20 ENCOUNTER — Ambulatory Visit: Payer: BC Managed Care – PPO

## 2013-03-20 ENCOUNTER — Ambulatory Visit (INDEPENDENT_AMBULATORY_CARE_PROVIDER_SITE_OTHER): Payer: BC Managed Care – PPO | Admitting: Family Medicine

## 2013-03-20 VITALS — BP 142/82 | HR 82 | Temp 98.1°F | Resp 16 | Ht 70.0 in | Wt 210.0 lb

## 2013-03-20 DIAGNOSIS — M79671 Pain in right foot: Secondary | ICD-10-CM

## 2013-03-20 DIAGNOSIS — M109 Gout, unspecified: Secondary | ICD-10-CM

## 2013-03-20 DIAGNOSIS — M79609 Pain in unspecified limb: Secondary | ICD-10-CM

## 2013-03-20 MED ORDER — ALLOPURINOL 100 MG PO TABS: 200.0000 mg | ORAL_TABLET | Freq: Every day | ORAL | Status: DC

## 2013-03-20 MED ORDER — HYDROCODONE-ACETAMINOPHEN 10-325 MG PO TABS: 1.0000 | ORAL_TABLET | ORAL | Status: DC | PRN

## 2013-03-20 MED ORDER — COLCHICINE 0.6 MG PO TABS: ORAL_TABLET | ORAL | Status: DC

## 2013-03-20 NOTE — Progress Notes (Signed)
[facility] [address], [place] [address] [phone]  Date:  03/20/2013   Name:  Wayne Neal   DOB:  Apr 18, 1955   MRN:  [id number]  PCP:  Lamar Blinks, MD    Chief Complaint: Gout   History of Present Illness:  Wayne Neal is a 58 y.o. very pleasant male patient who presents with the following:  He was helping his daughter to move about 10 days ago.he stubbed his right great toe.  It hurt a bit but he did not think much of it. However the next day he noted heat, swelling, redness and tenderness.  We had tried increasing his allopurinol to 200 at his last visit on 1/6; unfortunately this did not help at least as of yet  He took #3 colcrys following the usual regimen for his flare.  He had been on allopurinol but stopped taking it because it did not seem to be helping.   He is frustrated by this persistent gout issue; he has had nearly monthly visits lately for his gout  He has had gout for about 10 years.  The flares are more frequent now.   He can have gout in either foot, his elbow, etc  Patient Active Problem List   Diagnosis Date Noted  . Gout 12/27/2012  . Dyslipidemia 10/30/2012  . PAF (paroxysmal atrial fibrillation) 10/30/2012  . HTN (hypertension) 05/01/2012  . Osteoarthritis of right knee 05/01/2012    Past Medical History  Diagnosis Date  . Arthritis   . Hypertension   . History of palpitations   . PAF (paroxysmal atrial fibrillation) 1996  . Hyperlipidemia     Past Surgical History  Procedure Laterality Date  . Total knee arthroplasty      right - 08/2012    History  Substance Use Topics  . Smoking status: Former Smoker    Quit date: 10/31/1982  . Smokeless tobacco: Current User    Types: Snuff  . Alcohol Use: 3.5 oz/week    7 drink(s) per week    Family History  Problem Relation Age of Onset  . Hyperlipidemia Mother   . Heart disease Father     CABG  . Hyperlipidemia Sister     Allergies  Allergen  Reactions  . Vytorin [Ezetimibe-Simvastatin]     myalgias    Medication list has been reviewed and updated.  Current Outpatient Prescriptions on File Prior to Visit  Medication Sig Dispense Refill  . amLODipine-olmesartan (AZOR) 5-20 MG per tablet Take 1 tablet by mouth daily.  90 tablet  3  . colchicine (COLCRYS) 0.6 MG tablet Take 2 tabs po x 1 at onset of gout flair then take 1 tab 1 hr later  30 tablet  11  . metoprolol tartrate (LOPRESSOR) 25 MG tablet Take 1 tablet (25 mg total) by mouth every morning.  90 tablet  3  . allopurinol (ZYLOPRIM) 100 MG tablet Take 2 tablets (200 mg total) by mouth daily. Start after resolution of acute flare of gout.  60 tablet  5  . atorvastatin (LIPITOR) 20 MG tablet Take 1 tablet (20 mg total) by mouth daily.  30 tablet  11  . HYDROcodone-acetaminophen (NORCO) 10-325 MG per tablet Take 1 tablet by mouth every 4 (four) hours as needed.  30 tablet  0   No current facility-administered medications on file prior to visit.    Review of Systems:  As per HPI- otherwise negative.   Physical Examination: Filed Vitals:  03/20/13 0954  BP: 142/82  Pulse: 82  Temp: 98.1 F (36.7 C)  Resp: 16   Filed Vitals:   03/20/13 0954  Height: 5\' 10"  (1.778 m)  Weight: 210 lb (95.255 kg)   Body mass index is 30.13 kg/(m^2). Ideal Body Weight: Weight in (lb) to have BMI = 25: 173.9  GEN: WDWN, NAD, Non-toxic, A & O x 3 HEENT: Atraumatic, Normocephalic. Neck supple. No masses, No LAD. Ears and Nose: No external deformity. CV: RRR, No M/G/R. No JVD. No thrill. No extra heart sounds. PULM: CTA B, no wheezes, crackles, rhonchi. No retractions. No resp. distress. No accessory muscle use. EXTR: No c/c/e NEURO Normal gait.  PSYCH: Normally interactive. Conversant. Not depressed or anxious appearing.  Calm demeanor.  Right foot: he has heat, tenderness and swelling at 1st MTP joint typical of acute gout.  Otherwise foot exam is normal   [facility] reading (PRIMARY)  by  Dr. Lorelei Pont. Right foot: degenerative change at 1st MTP.  No fracture apparent   RIGHT FOOT COMPLETE - 3+ VIEW  COMPARISON: None.  FINDINGS: There is no evidence of fracture or dislocation. There is chronic change of the fifth proximal phalanx. There is mild soft tissue swelling around the first metatarsal phalangeal joint.  IMPRESSION: No acute fracture or dislocation. Mild soft tissues swelling around the first metatarsophalangeal joint.  Assessment and Plan: Gout - Plan: HYDROcodone-acetaminophen (NORCO) 10-325 MG per tablet, colchicine (COLCRYS) 0.6 MG tablet, allopurinol (ZYLOPRIM) 100 MG tablet, Comprehensive metabolic panel, Uric Acid, Uric Acid  Right foot pain - Plan: DG Foot Complete Right  Recurrent gout flares.  Will continue colchicine and pain medication for now. As soon as this flare is over plan to increase his allopurinol with goal of getting his uric acid under 6 and lessening his flares. Reminded to drink plenty of liquids as we want his urine output to be 2liters a day.  Wayne Neal accidentally left clinic before his blood was drawn- called him and he will return tomorrow for a blood draw.  He does NOT have to pay another copay to have these labs.    Signed Lamar Blinks, MD

## 2013-03-20 NOTE — Patient Instructions (Signed)
You can continue to take colchicine 1 or 2 a day until this flare is over.  Use the pain medication as needed but be careful of sedation.    Once your current flare is over, stop the colchicine and go back on allopurinol 2 tablets (200 mg) a day.  You can increase by 100 mg (1 pill) each week.  When you get to 300 or 400 mg come by for a lab visit only and we will check your uric acid level.  We want you to get your uric acid under 6; hopefully this will prevent more gout attacks.    Please keep me posted as to your progress

## 2013-04-03 ENCOUNTER — Other Ambulatory Visit: Payer: Self-pay | Admitting: Family Medicine

## 2013-04-03 ENCOUNTER — Encounter: Payer: Self-pay | Admitting: Family Medicine

## 2013-04-03 DIAGNOSIS — M109 Gout, unspecified: Secondary | ICD-10-CM

## 2013-04-16 ENCOUNTER — Other Ambulatory Visit (INDEPENDENT_AMBULATORY_CARE_PROVIDER_SITE_OTHER): Payer: BC Managed Care – PPO

## 2013-04-16 DIAGNOSIS — M109 Gout, unspecified: Secondary | ICD-10-CM

## 2013-04-16 LAB — COMPREHENSIVE METABOLIC PANEL
ALT: 21 U/L (ref 0–53)
AST: 21 U/L (ref 0–37)
Albumin: 4.4 g/dL (ref 3.5–5.2)
Alkaline Phosphatase: 68 U/L (ref 39–117)
BUN: 11 mg/dL (ref 6–23)
CHLORIDE: 102 meq/L (ref 96–112)
CO2: 27 meq/L (ref 19–32)
CREATININE: 0.89 mg/dL (ref 0.50–1.35)
Calcium: 9 mg/dL (ref 8.4–10.5)
GLUCOSE: 107 mg/dL — AB (ref 70–99)
Potassium: 3.9 mEq/L (ref 3.5–5.3)
Sodium: 141 mEq/L (ref 135–145)
Total Bilirubin: 0.7 mg/dL (ref 0.2–1.2)
Total Protein: 6.7 g/dL (ref 6.0–8.3)

## 2013-04-16 LAB — URIC ACID: Uric Acid, Serum: 6.9 mg/dL (ref 4.0–7.8)

## 2013-04-17 ENCOUNTER — Encounter: Payer: Self-pay | Admitting: Family Medicine

## 2013-04-17 ENCOUNTER — Telehealth: Payer: Self-pay | Admitting: Family Medicine

## 2013-04-17 DIAGNOSIS — M109 Gout, unspecified: Secondary | ICD-10-CM

## 2013-04-17 NOTE — Telephone Encounter (Signed)
Called and LMOM.  His uric acid is still above 6.  If he is taking allopurinol 200 mg, add a pill to make 300 mg (if just taking 100 increase to 200) and let's check as a lab visit only in one month.  Glucose is 107; ok for a random, a little high for a fasting. We will watch this.

## 2013-10-23 ENCOUNTER — Other Ambulatory Visit: Payer: Self-pay | Admitting: Internal Medicine

## 2013-10-23 MED ORDER — ATORVASTATIN CALCIUM 20 MG PO TABS: 20.0000 mg | ORAL_TABLET | Freq: Every day | ORAL | Status: DC

## 2013-10-23 NOTE — Telephone Encounter (Signed)
Pt called and said he still have not received his Metoprolol, Azo,and Atorvastatin(he thinks), Please call to [facility] [address].

## 2013-11-20 ENCOUNTER — Ambulatory Visit (INDEPENDENT_AMBULATORY_CARE_PROVIDER_SITE_OTHER): Payer: BC Managed Care – PPO | Admitting: Internal Medicine

## 2013-11-20 ENCOUNTER — Encounter: Payer: Self-pay | Admitting: Internal Medicine

## 2013-11-20 VITALS — BP 172/98 | HR 88 | Ht 71.0 in | Wt 215.3 lb

## 2013-11-20 DIAGNOSIS — E785 Hyperlipidemia, unspecified: Secondary | ICD-10-CM

## 2013-11-20 DIAGNOSIS — I480 Paroxysmal atrial fibrillation: Secondary | ICD-10-CM

## 2013-11-20 DIAGNOSIS — I10 Essential (primary) hypertension: Secondary | ICD-10-CM

## 2013-11-20 MED ORDER — METOPROLOL TARTRATE 25 MG PO TABS: ORAL_TABLET | ORAL | Status: AC

## 2013-11-20 MED ORDER — AMLODIPINE-OLMESARTAN 5-40 MG PO TABS: 1.0000 | ORAL_TABLET | Freq: Every day | ORAL | Status: DC

## 2013-11-20 MED ORDER — ATORVASTATIN CALCIUM 20 MG PO TABS: 20.0000 mg | ORAL_TABLET | Freq: Every day | ORAL | Status: DC

## 2013-11-20 NOTE — Patient Instructions (Addendum)
Your physician recommends that you schedule a follow-up appointment in 2 weeks with Erasmo Downer (pharmacist) for BP check  >> please bring a list of your home BP recordings with you to this visit (and BP cuff if you have one)  Your physician wants you to follow-up in: 1 year with Dr. Debara Pickett. You will receive a reminder letter in the mail two months in advance. If you don't receive a letter, please call our office to schedule the follow-up appointment.  Your physician recommends that you return for lab work FASTING

## 2013-11-20 NOTE — Progress Notes (Signed)
OFFICE NOTE  Chief Complaint:  Routine office visit  Primary Care Physician: Lamar Blinks, MD  HPI:  Wayne Neal is a pleasant 58 year old gentleman with a history of one episode of atrial fibrillation in 1996. This spontaneously converted back to sinus and has not been on any anticoagulation since then. He's been on metoprolol which seems to keep him rate controlled and he has very infrequent palpitations. He has had problems with hypertension and has been taking Azor 10/40 mg and metoprolol. Recently he's had about 10 or 15 pounds of weight loss and after his surgery has had lower blood pressures. He attributes the lower blood pressures to weight loss however this may be do to the fact that he is no longer in chronic pain. Either way his blood pressures were lower recently in the 93Y or 10F systolic. He has cut his Azor 10/40 in half and he feels less symptomatic with this. He denies any chest pain or shortness of breath with exertion. He does not have an active primary care provider has not had a test of his cholesterol in some time.  He recently had right total knee replacement at [facility] and is recovering from that.  Mr. Mitro returns today for followup. It is noted that his blood pressure is again elevated. He had significant reduction in his blood pressure medications from the 10/40 mg tablet to be 5/20 mg tablet. He subsequently had a small amount of weight gain. He is asymptomatic at this time.  PMHx:  Past Medical History  Diagnosis Date  . Arthritis   . Hypertension   . History of palpitations   . PAF (paroxysmal atrial fibrillation) 1996  . Hyperlipidemia     Past Surgical History  Procedure Laterality Date  . Total knee arthroplasty      right - 08/2012    FAMHx:  Family History  Problem Relation Age of Onset  . Hyperlipidemia Mother   . Heart disease Father     CABG  . Hyperlipidemia Sister     SOCHx:   reports that he quit smoking about 31  years ago. His smokeless tobacco use includes Snuff. He reports that he drinks about 3.5 ounces of alcohol per week. He reports that he does not use illicit drugs.  ALLERGIES:  Allergies  Allergen Reactions  . Vytorin [Ezetimibe-Simvastatin]     myalgias    ROS: A comprehensive review of systems was negative.  HOME MEDS: Current Outpatient Prescriptions  Medication Sig Dispense Refill  . allopurinol (ZYLOPRIM) 100 MG tablet Take 2 tablets (200 mg total) by mouth daily. Start after resolution of acute flare of gout. Increase by 100mg  a week- max 600  180 tablet  6  . amLODipine-olmesartan (AZOR) 5-40 MG per tablet Take 1 tablet by mouth daily.  90 tablet  3  . atorvastatin (LIPITOR) 20 MG tablet Take 1 tablet (20 mg total) by mouth daily.  30 tablet  0  . colchicine (COLCRYS) 0.6 MG tablet Take 2 tabs po x 1 at onset of gout flair then take 1 tab 1 hr later.  May continue 1 or 2 pills a day for duration of flare.  70 tablet  6  . metoprolol tartrate (LOPRESSOR) 25 MG tablet Take 1 tablet (25 mg total) by mouth every morning.  90 tablet  3   No current facility-administered medications for this visit.    LABS/IMAGING: No results found for this or any previous visit (from the past 48 hour(s)). No results found.  VITALS: BP 172/98  Pulse 88  Ht 5\' 11"  (1.803 m)  Wt 215 lb 4.8 oz (97.659 kg)  BMI 30.04 kg/m2  EXAM: General appearance: alert and no distress Neck: no adenopathy, no carotid bruit, no JVD, supple, symmetrical, trachea midline and thyroid not enlarged, symmetric, no tenderness/mass/nodules Lungs: clear to auscultation bilaterally Heart: regular rate and rhythm, S1, S2 normal, no murmur, click, rub or gallop Abdomen: soft, non-tender; bowel sounds normal; no masses,  no organomegaly Extremities: extremities normal, atraumatic, no cyanosis or edema, the right knee is slightly warm, healing midline incision, mild effusion Pulses: 2+ and symmetric Skin: Skin color,  texture, turgor normal. No rashes or lesions Neurologic: Grossly normal  EKG: Sinus rhythm at 88  ASSESSMENT: 1. Hypertension-poorly controlled 2. Remote solitary atrial fibrillation episode 3. Dyslipidemia- able to tolerate lipitor 20 mg  PLAN: 1.   Mr. Carreker is doing well after his right total knee replacement. His blood pressure is again poorly controlled. I suspect this may be due to some weight gain and the fact that he is no longer taking regular pain medication. I would recommend increasing his 80s or back to 5/40 mg daily. We will leave followup with Erasmo Downer our pharmacist for blood pressure management. In addition he is due for repeat lipid NMR to see these had improvement in his lipid profile on atorvastatin. Followup with Erasmo Downer in 2 weeks and me annually.  Pixie Casino, MD, [facility] Attending Cardiologist [facility] 11/20/2013, 4:05 PM

## 2013-12-01 ENCOUNTER — Ambulatory Visit: Payer: BC Managed Care – PPO | Admitting: Pharmacist Clinician (PhC)/ Clinical Pharmacy Specialist

## 2013-12-12 ENCOUNTER — Ambulatory Visit (INDEPENDENT_AMBULATORY_CARE_PROVIDER_SITE_OTHER): Payer: BC Managed Care – PPO | Admitting: Pharmacist Clinician (PhC)/ Clinical Pharmacy Specialist

## 2013-12-12 VITALS — BP 156/82 | HR 80 | Ht 71.0 in | Wt 213.2 lb

## 2013-12-12 DIAGNOSIS — I10 Essential (primary) hypertension: Secondary | ICD-10-CM

## 2013-12-14 ENCOUNTER — Encounter: Payer: Self-pay | Admitting: Pharmacist Clinician (PhC)/ Clinical Pharmacy Specialist

## 2013-12-14 LAB — NMR LIPOPROFILE WITH LIPIDS
Cholesterol, Total: 151 mg/dL (ref 100–199)
HDL Particle Number: 31.8 umol/L (ref >=30.5)
HDL SIZE: 9.3 nm (ref >=9.2)
HDL-C: 52 mg/dL (ref >39)
LDL CALC: 62 mg/dL (ref 0–99)
LDL PARTICLE NUMBER: 1015 nmol/L — AB (ref <1000)
LDL Size: 20.7 nm (ref >=20.8)
LP-IR Score: 52 — ABNORMAL HIGH (ref <=45)
Large HDL-P: 6.8 umol/L (ref >=4.8)
Large VLDL-P: 7.8 nmol/L — ABNORMAL HIGH (ref <=2.7)
SMALL LDL PARTICLE NUMBER: 360 nmol/L (ref <=527)
Triglycerides: 184 mg/dL — ABNORMAL HIGH (ref 0–149)
VLDL SIZE: 51.2 nm — AB (ref <=46.6)

## 2013-12-14 NOTE — Patient Instructions (Signed)
Your blood pressure today is 156/82   Check your blood pressure at home several times per week and keep a record of the readings.  Take your BP meds as follows: continue with the Azor 5/40  Bring all of your meds, your BP cuff and your record of home blood pressures to your next appointment.  Exercise as you're able, try to walk approximately 30 minutes per day.  Keep salt intake to a minimum, especially watch canned and prepared boxed foods.  Eat more fresh fruits and vegetables and fewer canned items.  Avoid eating in fast food restaurants.    HOW TO TAKE YOUR BLOOD PRESSURE:   Rest 5 minutes before taking your blood pressure.    Don't smoke or drink caffeinated beverages for at least 30 minutes before.   Take your blood pressure before (not after) you eat.   Sit comfortably with your back supported and both feet on the floor (don't cross your legs).   Elevate your arm to heart level on a table or a desk.   Use the proper sized cuff. It should fit smoothly and snugly around your bare upper arm. There should be enough room to slip a fingertip under the cuff. The bottom edge of the cuff should be 1 inch above the crease of the elbow.   Ideally, take 3 measurements at one sitting and record the average.

## 2013-12-14 NOTE — Progress Notes (Signed)
     12/14/2013 Wayne Neal Mar 07, 1955 [id number]   HPI:  Wayne Neal is a 58 y.o. male patient of Dr Debara Pickett, with a PMH below who presents today for hypertension clinic evaluation.  His cardiac history is significant only for an episode of atrial fibrillation in 1996 that spontaneously converted.  He has maintained control on metoprolol since then.  He had been on Azor 10/40 for awhile but it was cut back to 5/40 when his blood pressure started dropping down into the 90/40 range.  This was after TKA and a 10-15 pound weight loss.  He is now back to work and states has gained much of the weight back.    His family history includes hypertension in both his father (who died at the age of 23) and his sister.  His mother is still living, has no cardiac issues and smokes 2 ppd.    He has a home BP cuff, a 76+ year old [employer] finger model, now no longer made.  He states most home readings are in the 130s/70s.  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  He eats "what I want", including lunch out most days of the week, but does not add salt to any meals.  He does not drink any caffeine, but does have a couple of beers each night.     Current Outpatient Prescriptions  Medication Sig Dispense Refill  . amLODipine-olmesartan (AZOR) 5-40 MG per tablet Take 1 tablet by mouth daily.  90 tablet  3  . atorvastatin (LIPITOR) 20 MG tablet Take 1 tablet (20 mg total) by mouth daily.  90 tablet  3  . colchicine (COLCRYS) 0.6 MG tablet Take 2 tabs po x 1 at onset of gout flair then take 1 tab 1 hr later.  May continue 1 or 2 pills a day for duration of flare.  70 tablet  6  . metoprolol tartrate (LOPRESSOR) 25 MG tablet Take 1 tablet (25 mg total) by mouth every morning.  90 tablet  3   No current facility-administered medications for this visit.    Allergies  Allergen Reactions  . Vytorin [Ezetimibe-Simvastatin]     myalgias    Past  Medical History  Diagnosis Date  . Arthritis   . Hypertension   . History of palpitations   . PAF (paroxysmal atrial fibrillation) 1996  . Hyperlipidemia     Blood pressure 156/82, pulse 80, height 5\' 11"  (1.803 m), weight 213 lb 3.2 oz (96.707 kg).   ASSESSMENT AND PLAN:  Although today his blood pressure is elevated in the office he assures me that the home numbers are much better.  His BP finger cuff is surprisingly accurate for its age.  He is going to work on getting his weight back down and is not eager to adjust his medication at this time.  I have asked him to watch his home readings and call if they trend to >140/90 consistently.   I warned him that we will probably have to increase his medication in the future, but am willing to let him work on dietary and weight changes for the time being.   Tommy Medal PharmD CPP [facility]

## 2013-12-16 ENCOUNTER — Encounter: Payer: Self-pay | Admitting: *Deleted

## 2014-02-21 ENCOUNTER — Other Ambulatory Visit: Payer: Self-pay | Admitting: Family Medicine

## 2014-03-26 ENCOUNTER — Telehealth: Payer: Self-pay | Admitting: Internal Medicine

## 2014-03-26 NOTE — Telephone Encounter (Signed)
Returned call to patient he stated his B/P has been elevated ranging 143/89,138/77,144/92, pulse ranging in 80's.Stated he wanted to ask Dr.Hilty if he needs to increase his medication.Message sent to Dr.Hilty for advice.

## 2014-03-26 NOTE — Telephone Encounter (Signed)
Pt called in stating that he feels that a combination of medications is making is BP high. He thinks that it may be his Azor and Metoprolol. He states that his BP increases when he is relaxing. Please f/u with pt  Thanks

## 2014-03-26 NOTE — Telephone Encounter (Signed)
No .. Those readings are not that bad.  Dr. Lemmie Evens

## 2014-03-27 MED ORDER — AMLODIPINE-OLMESARTAN 10-40 MG PO TABS: 1.0000 | ORAL_TABLET | Freq: Every day | ORAL | Status: DC

## 2014-03-27 NOTE — Telephone Encounter (Signed)
Returned call to patient he stated he will be going to [employer] 04/13/14 and they require B/P to be under 140/90.Stated he is afraid when he goes B/P will be elevated and they want issue his driver's license.Stated he would like to increase one of his B/P meds.Message sent to Dr.Hilty.

## 2014-03-27 NOTE — Telephone Encounter (Signed)
Ok ..please Rx for Azor 10/40 (an increase from his current dose of 5/40).  Dr. Lemmie Evens

## 2014-03-27 NOTE — Telephone Encounter (Signed)
Returned call to patient Dr.Hilty advised to increase Azor 10/40 mg daily.Advised to continue to monitor B/P and call back if needed.

## 2014-06-25 ENCOUNTER — Telehealth: Payer: Self-pay

## 2014-06-25 NOTE — Telephone Encounter (Signed)
PA started for Colchicine 0.6mg .

## 2014-06-26 ENCOUNTER — Telehealth: Payer: Self-pay | Admitting: *Deleted

## 2014-06-26 NOTE — Telephone Encounter (Signed)
Faxed prior authorization for amlodipine-olmesartan 10/40mg  QD to [facility]

## 2014-06-29 ENCOUNTER — Encounter (HOSPITAL_COMMUNITY): Payer: Self-pay

## 2014-06-29 ENCOUNTER — Ambulatory Visit (HOSPITAL_COMMUNITY)
Admission: RE | Admit: 2014-06-29 | Discharge: 2014-06-29 | Disposition: A | Discharge status: Home / Self Care | Payer: 59 | Source: Ambulatory Visit | Attending: Physician Assistant | Admitting: Physician Assistant

## 2014-06-29 ENCOUNTER — Ambulatory Visit (INDEPENDENT_AMBULATORY_CARE_PROVIDER_SITE_OTHER): Payer: 59 | Admitting: Family Medicine

## 2014-06-29 ENCOUNTER — Ambulatory Visit (INDEPENDENT_AMBULATORY_CARE_PROVIDER_SITE_OTHER): Payer: 59

## 2014-06-29 VITALS — BP 126/74 | HR 93 | Temp 98.2°F | Resp 18 | Ht 70.0 in | Wt 196.0 lb

## 2014-06-29 DIAGNOSIS — I2510 Atherosclerotic heart disease of native coronary artery without angina pectoris: Secondary | ICD-10-CM | POA: Insufficient documentation

## 2014-06-29 DIAGNOSIS — R599 Enlarged lymph nodes, unspecified: Secondary | ICD-10-CM

## 2014-06-29 DIAGNOSIS — R634 Abnormal weight loss: Secondary | ICD-10-CM

## 2014-06-29 DIAGNOSIS — R630 Anorexia: Secondary | ICD-10-CM | POA: Diagnosis not present

## 2014-06-29 DIAGNOSIS — N2889 Other specified disorders of kidney and ureter: Secondary | ICD-10-CM | POA: Diagnosis not present

## 2014-06-29 DIAGNOSIS — R918 Other nonspecific abnormal finding of lung field: Secondary | ICD-10-CM | POA: Diagnosis not present

## 2014-06-29 DIAGNOSIS — C642 Malignant neoplasm of left kidney, except renal pelvis: Secondary | ICD-10-CM | POA: Diagnosis not present

## 2014-06-29 DIAGNOSIS — R5383 Other fatigue: Secondary | ICD-10-CM | POA: Diagnosis not present

## 2014-06-29 DIAGNOSIS — R590 Localized enlarged lymph nodes: Secondary | ICD-10-CM | POA: Diagnosis present

## 2014-06-29 DIAGNOSIS — R591 Generalized enlarged lymph nodes: Secondary | ICD-10-CM | POA: Diagnosis not present

## 2014-06-29 LAB — COMPLETE METABOLIC PANEL WITH GFR
ALBUMIN: 3.6 g/dL (ref 3.5–5.2)
ALT: 28 U/L (ref 0–53)
AST: 26 U/L (ref 0–37)
Alkaline Phosphatase: 107 U/L (ref 39–117)
BILIRUBIN TOTAL: 0.7 mg/dL (ref 0.2–1.2)
BUN: 13 mg/dL (ref 6–23)
CALCIUM: 9.8 mg/dL (ref 8.4–10.5)
CHLORIDE: 98 meq/L (ref 96–112)
CO2: 30 meq/L (ref 19–32)
Creat: 0.7 mg/dL (ref 0.50–1.35)
GFR, Est Non African American: >89 mL/min
GLUCOSE: 109 mg/dL — AB (ref 70–99)
POTASSIUM: 4.8 meq/L (ref 3.5–5.3)
SODIUM: 136 meq/L (ref 135–145)
TOTAL PROTEIN: 7.4 g/dL (ref 6.0–8.3)

## 2014-06-29 LAB — POCT CBC
Granulocyte percent: 77.1 %G (ref 37–80)
HEMATOCRIT: 42.4 % — AB (ref 43.5–53.7)
HEMOGLOBIN: 13.5 g/dL — AB (ref 14.1–18.1)
Lymph, poc: 1.9 (ref 0.6–3.4)
MCH: 27.2 pg (ref 27–31.2)
MCHC: 31.8 g/dL (ref 31.8–35.4)
MCV: 85.5 fL (ref 80–97)
MID (CBC): 0.1 (ref 0–0.9)
MPV: 7.7 fL (ref 0–99.8)
POC GRANULOCYTE: 6.9 (ref 2–6.9)
POC LYMPH %: 21.4 % (ref 10–50)
POC MID %: 1.5 %M (ref 0–12)
Platelet Count, POC: 271 10*3/uL (ref 142–424)
RBC: 4.96 M/uL (ref 4.69–6.13)
RDW, POC: 15.5 %
WBC: 8.9 10*3/uL (ref 4.6–10.2)

## 2014-06-29 LAB — TSH: TSH: 3.806 u[IU]/mL (ref 0.350–4.500)

## 2014-06-29 MED ORDER — HYDROCODONE-ACETAMINOPHEN 5-325 MG PO TABS: 1.0000 | ORAL_TABLET | Freq: Three times a day (TID) | ORAL | Status: DC | PRN

## 2014-06-29 MED ORDER — IOHEXOL 300 MG/ML  SOLN
80.0000 mL | Freq: Once | INTRAMUSCULAR | Status: AC | PRN
  Administered 2014-06-29: 80 mL via INTRAVENOUS

## 2014-06-29 NOTE — Progress Notes (Signed)
I have seen and examined this pt along with Ms. Tawni Pummel.     CHEST 2 VIEW  COMPARISON: October 31, 2005  FINDINGS: There is no edema or consolidation. Heart size and pulmonary vascularity are normal. No adenopathy. No pneumothorax. There is postoperative change in the lower cervical spine region.  IMPRESSION: No edema or consolidation.   Received CT results as below:  IMPRESSION: 1. Large heterogeneously enhancing incompletely visualized mass replaces upper pole of the left kidney and is highly suspicious for renal cell carcinoma. This is associated with bulky upper abdominal lymphadenopathy consistent with metastatic disease. Dedicated CT scanning of the abdomen and pelvis with oral and IV contrast would be helpful to further evaluate. 2. Bulky left supraclavicular lymphadenopathy with smaller lymphadenopathy identified in the mediastinum. 3. Scattered small pulmonary nodules, also suspicious for metastatic involvement. 4. These results will be called to the ordering clinician or representative by the Radiologist Assistant, and communication documented in the PACS or zVision Dashboard.  Called pt at 5:10 pm regarding his CT scan.  Was not able to reach, will try back.   Called back at 5:40 pm. Spoke with pt and his wife.  Gave them the news as above.  They understand- will do a CT abd/ pelvis tomorrow and then I will refer to hem/onc and/ or urology right away.   He would also like medication for pain and I will rx vicodin  Meds ordered this encounter  Medications  . HYDROcodone-acetaminophen (NORCO/VICODIN) 5-325 MG per tablet    Sig: Take 1-2 tablets by mouth every 8 (eight) hours as needed.    Dispense:  30 tablet    Refill:  0

## 2014-06-29 NOTE — Patient Instructions (Signed)
I will let you know the results of your lab tests. If CT chest negative, will send you to get biopsy of your lymph node. Go to [facility] at 3:00.

## 2014-06-29 NOTE — Progress Notes (Signed)
Subjective:    Patient ID: Wayne Neal, male    DOB: 04-Mar-1955, 59 y.o.   MRN: [id number]  HPI  This is a 59 year old male with PMH HLD and HTN who is presenting with weight loss, fatigue and decreased appetite x 3-4 weeks. 2 days ago he noticed a lump above his left clavicle. A couple of times he has had night sweats. He is getting flushed with minimal activity. He has lost 17 pounds and has not changed anything other than not eating as much due to decreased appetite.  1 week ago had some upper abdominal pain past that lasted 3-4 days. He went to another urgent care 2 days ago for this and had H pylori breath test which was negative. He is no longer having this pain. He denies nausea or heartburn. BMs have not changed. No blood in stool. Last colonoscopy 8 years ago. Was supposed to be on 5 year cycle due to polyps, but hasn't been back since. Does not smoke currently - quit in 1984, had smoker 1 PPD for several years. Denies current SOB, wheezing or cough. No trouble swallowing. No CP or palpitations. States he has thyroid disorder in his family but no personal history. Only cancer history in family is grandfather who died from lung cancer. He was not a smoker but did work around tobacco. 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  Wt Readings from Last 3 Encounters:  06/29/14 196 lb (88.905 kg)  12/14/13 213 lb 3.2 oz (96.707 kg)  11/20/13 215 lb 4.8 oz (97.659 kg)   Review of Systems  Constitutional: Positive for diaphoresis, appetite change, fatigue and unexpected weight change. Negative for fever and chills.  HENT: Negative for trouble swallowing and voice change.   Eyes: Negative for visual disturbance.  Respiratory: Negative for cough, shortness of breath and wheezing.   Cardiovascular: Negative for chest pain, palpitations and leg swelling.  Gastrointestinal: Negative for nausea, vomiting, abdominal pain, diarrhea and blood in stool.  Genitourinary: Negative for dysuria and hematuria.    Musculoskeletal: Negative for arthralgias.  Skin: Negative for rash.  Hematological: Positive for adenopathy.    Patient Active Problem List   Diagnosis Date Noted  . Gout 12/27/2012  . Dyslipidemia 10/30/2012  . PAF (paroxysmal atrial fibrillation) 10/30/2012  . HTN (hypertension) 05/01/2012  . Osteoarthritis of right knee 05/01/2012   Prior to Admission medications   Medication Sig Start Date End Date Taking? Authorizing Provider  amLODipine-olmesartan (AZOR) 10-40 MG per tablet Take 1 tablet by mouth daily. 03/27/14  Yes Pixie Casino, MD  colchicine 0.6 MG tablet TAKE 2 AT ONSET OF GOUT FLAIR, THEN 1 TABLET 1 HOURS LATER. MAY CONTINUE 1-2 A DAY FOR DURATION 02/23/14  Yes Chelle S Jeffery, PA-C  metoprolol tartrate (LOPRESSOR) 25 MG tablet Take 1 tablet (25 mg total) by mouth every morning. 11/20/13  Yes Pixie Casino, MD  atorvastatin (LIPITOR) 20 MG tablet Take 1 tablet (20 mg total) by mouth daily. Patient not taking: Reported on 06/29/2014 11/20/13   Pixie Casino, MD   Allergies  Allergen Reactions  . Vytorin [Ezetimibe-Simvastatin]     myalgias   Patient's social and family history were reviewed.     Objective:   Physical Exam  Constitutional: He is oriented to person, place, and time. He appears well-developed and well-nourished. No distress.  HENT:  Head: Normocephalic and atraumatic.  Right Ear: Hearing, tympanic membrane, external ear and ear canal normal.  Left Ear: Hearing, tympanic membrane, external ear  and ear canal normal.  Nose: Nose normal.  Mouth/Throat: Uvula is midline, oropharynx is clear and moist and mucous membranes are normal.  Eyes: Conjunctivae and lids are normal. Right eye exhibits no discharge. Left eye exhibits no discharge. No scleral icterus.  Neck: No thyromegaly present.  Cardiovascular: Normal rate, regular rhythm, normal heart sounds and normal pulses.   No murmur heard. Pulmonary/Chest: Effort normal and breath sounds normal. No  respiratory distress. He has no wheezes. He has no rhonchi. He has no rales.  Abdominal: Soft. Normal appearance. There is no tenderness.  Musculoskeletal: Normal range of motion.  Lymphadenopathy:       Head (right side): No submental, no submandibular, no tonsillar, no preauricular, no posterior auricular and no occipital adenopathy present.       Head (left side): No submental, no submandibular, no tonsillar, no preauricular, no posterior auricular and no occipital adenopathy present.    He has no cervical adenopathy.    He has no axillary adenopathy.       Right: No supraclavicular adenopathy present.       Left: Supraclavicular (golf ball size) adenopathy present.  Neurological: He is alert and oriented to person, place, and time.  Skin: Skin is warm, dry and intact. No lesion and no rash noted.  Psychiatric: He has a normal mood and affect. His speech is normal and behavior is normal. Thought content normal.   BP 126/74 mmHg  Pulse 93  Temp(Src) 98.2 F (36.8 C) (Oral)  Resp 18  Ht 5\' 10"  (1.778 m)  Wt 196 lb (88.905 kg)  BMI 28.12 kg/m2  SpO2 97%  Results for orders placed or performed in visit on 06/29/14  POCT CBC  Result Value Ref Range   WBC 8.9 4.6 - 10.2 K/uL   Lymph, poc 1.9 0.6 - 3.4   POC LYMPH PERCENT 21.4 10 - 50 %L   MID (cbc) 0.1 0 - 0.9   POC MID % 1.5 0 - 12 %M   POC Granulocyte 6.9 2 - 6.9   Granulocyte percent 77.1 37 - 80 %G   RBC 4.96 4.69 - 6.13 M/uL   Hemoglobin 13.5 (A) 14.1 - 18.1 g/dL   HCT, POC 42.4 (A) 43.5 - 53.7 %   MCV 85.5 80 - 97 fL   MCH, POC 27.2 27 - 31.2 pg   MCHC 31.8 31.8 - 35.4 g/dL   RDW, POC 15.5 %   Platelet Count, POC 271 142 - 424 K/uL   MPV 7.7 0 - 99.8 fL   [facility] reading (PRIMARY) by  Dr. Lorelei Pont: increased right hilar markings, no definite mass seen.     Assessment & Plan:  1. Loss of weight 2. Enlarged lymph node 3. Decreased appetite 4. Other fatigue Pt's symptoms and exam concerning for neoplastic process.  Radiograph negative. CBC only with mild anemia. TSH and CMP pending. Pt sent for stat CT chest to work up cancer. If CT chest not more definitive for what is going on, will send for biopsy of enlarged lymph node. - DG Chest 2 View; Future - TSH - CT Chest W Contrast; Future - COMPLETE METABOLIC PANEL WITH GFR - CT Chest W Contrast; Future - POCT CBC - COMPLETE METABOLIC PANEL WITH GFR  Benjaman Pott. Drenda Freeze, M[facility]  06/29/2014'

## 2014-06-29 NOTE — Telephone Encounter (Signed)
PA denied for Colchicine. Pt has to try [place] first and fail. Please write Rx for Mitagare.

## 2014-06-30 ENCOUNTER — Encounter (HOSPITAL_COMMUNITY): Payer: Self-pay

## 2014-06-30 ENCOUNTER — Telehealth: Payer: Self-pay

## 2014-06-30 ENCOUNTER — Telehealth: Payer: Self-pay | Admitting: *Deleted

## 2014-06-30 ENCOUNTER — Ambulatory Visit (HOSPITAL_COMMUNITY)
Admission: RE | Admit: 2014-06-30 | Discharge: 2014-06-30 | Disposition: A | Discharge status: Home / Self Care | Payer: 59 | Source: Ambulatory Visit | Attending: Family Medicine | Admitting: Family Medicine

## 2014-06-30 DIAGNOSIS — C642 Malignant neoplasm of left kidney, except renal pelvis: Secondary | ICD-10-CM

## 2014-06-30 DIAGNOSIS — R599 Enlarged lymph nodes, unspecified: Secondary | ICD-10-CM

## 2014-06-30 DIAGNOSIS — R634 Abnormal weight loss: Secondary | ICD-10-CM

## 2014-06-30 MED ORDER — AMLODIPINE BESYLATE 10 MG PO TABS: 10.0000 mg | ORAL_TABLET | Freq: Every day | ORAL | Status: DC

## 2014-06-30 MED ORDER — OLMESARTAN MEDOXOMIL 40 MG PO TABS: 40.0000 mg | ORAL_TABLET | Freq: Every day | ORAL | Status: DC

## 2014-06-30 MED ORDER — IOHEXOL 300 MG/ML  SOLN
100.0000 mL | Freq: Once | INTRAMUSCULAR | Status: AC | PRN
  Administered 2014-06-30: 100 mL via INTRAVENOUS

## 2014-06-30 MED ORDER — COLCHICINE 0.6 MG PO CAPS
2.0000 | ORAL_CAPSULE | Freq: Once | ORAL | Status: DC
Start: 2014-06-30 — End: 2014-07-09

## 2014-06-30 NOTE — Telephone Encounter (Signed)
Received notification that combination medication NOT approved.   Needs to have failed amlodipine 10 + benicar 40 separately   Routed to Dr. Debara Pickett

## 2014-06-30 NOTE — Telephone Encounter (Signed)
I've filled the mitigare. 30 pills with no refills to treat him in case of several flares. He is to take 2 pills at the onset of a flare followed by 1 flare 1 hour later.

## 2014-06-30 NOTE — Telephone Encounter (Signed)
Pt was called and advised of an scheduled CT at [facility] today 06/30/2014 at 11 am to start drinking contrast.  Pt was also advised to have nothing to eat from this point (9:37 am) on. Pt understood and was aware of where to report to in the hospital

## 2014-06-30 NOTE — Telephone Encounter (Signed)
Communicated to patient necessary med change - he has been taking amlodipine-olmesartan 5/40 most days as he reports he lost a lot of weight. Informed patient to contact our office if he is having to take lower strength amlodipine more frequently - new Rx is for 10mg .   He also reports he was diagnosed with cancer today (EPIC)

## 2014-06-30 NOTE — Telephone Encounter (Signed)
That stinks about the cancer. Have him log his BP's to make sure we are not over-treating him.  Dr. Lemmie Evens

## 2014-06-30 NOTE — Telephone Encounter (Signed)
Ok to split the medications - if benicar not covered, then can switch to irbesartan 150 mg daily and amlodipine 10 mg daily.  Dr. Lemmie Evens

## 2014-06-30 NOTE — Telephone Encounter (Signed)
Patients wife Joelene Millin wants Dr. Lorelei Pont to call her regarding [place].   [phone]

## 2014-06-30 NOTE — Telephone Encounter (Signed)
Called and [facility] for [place].  Certainly proceed with plans for her mother's arrangements.  We would like to have Almando seen by oncology within a few days but he will not be having surgery right away.  I am sorry to hear this news!

## 2014-06-30 NOTE — Telephone Encounter (Signed)
Pt.notified

## 2014-06-30 NOTE — Addendum Note (Signed)
Addended by: Fidel Levy on: 06/30/2014 03:33 PM   Modules accepted: Orders, Medications

## 2014-06-30 NOTE — Telephone Encounter (Signed)
Spoke with pt's wife, she states she talked to her husband about the removal of his kidney and would not know for 24 hours. She states his mother passed away and she would like to know when his surgery would be because they have to make funeral arrangements and they leave tomorrow at 10 am. Please call.

## 2014-06-30 NOTE — Telephone Encounter (Signed)
Called him regarding CT scan.  Went over results as below.  I have spoken with Dr. Earlie Server and will contact the new pt coordinator.  Have asked pt to let me know if he does not hear from new pt coordinator within 1-2 days.  He reports that the pain medication did help and he was able to sleep much better last night.    Called and LMOM with new pt coordinator and will place stat referral.   CT ABDOMEN AND PELVIS WITH CONTRAST  TECHNIQUE: Multidetector CT imaging of the abdomen and pelvis was performed using the standard protocol following bolus administration of intravenous contrast.  CONTRAST: 170mL OMNIPAQUE IOHEXOL 300 MG/ML SOLN  COMPARISON: 06/29/2014 chest CT. No comparison CT scan of the abdomen and pelvis.  FINDINGS: 11 x 10 x 12 cm bulky lobulated mass replaces majority of the mid to superior aspect of the left kidney most consistent with renal cell carcinoma.  Marked surrounding bulky adenopathy surrounds and envelops the abdominal aorta and left renal artery. Adenopathy partially surrounds and anteriorly displaces the right renal artery. This bulky adenopathy causes anterior displacement and narrowing of the left renal vein but without tumor thrombus detected. Bulky adenopathy surrounds the celiac axis and peripancreatic region. Less bulky adenopathy retrocrural and lower left para-aortic region.  Adenopathy versus low mental metastatic 2 cm lobulated mass anterior to the liver. Small epicardial lymph nodes.  Lung base nodules largest left lung base measuring 1.2 x 0.9 cm consistent with metastatic disease.  No osseous destructive lesion.  Focal fatty infiltration liver within the fissure for the ligamentum teres without worrisome hepatic lesion. No focal splenic lesion. No primary pancreatic lesion seen separate from bulky adenopathy. No right renal lesion or right adrenal lesion. Left adrenal gland surrounded by bulky adenopathy and evaluation  limited.  No calcified gallstones. Dense bile incidentally noted.  No primary bowel abnormality. Stomach under distended and evaluation slightly limited.  Noncontrast filled views the urinary bladder without primary mass. Prostate gland within the range of normal limits.  Atherosclerotic type changes coronary arteries, abdominal aorta (with ectasia but without focal aneurysm) and involving abdominal aortic branch vessels including iliac arteries and femoral arteries  IMPRESSION: 11 x 10 x 12 cm bulky lobulated mass replaces majority of the mid to superior aspect of the left kidney most consistent with renal cell carcinoma. Marked surrounding bulky adenopathy surrounds and envelops the abdominal aorta and left renal artery. Adenopathy partially surrounds and anteriorly displaces the right renal artery. This bulky adenopathy causes anterior displacement and narrowing of the left renal vein but without tumor thrombus detected. Bulky adenopathy surrounds the celiac axis and peripancreatic region. Less bulky adenopathy retrocrural and lower left para-aortic region. Adenopathy versus omental metastatic 2 cm lobulated mass anterior to the liver. Small epicardial lymph nodes.  Lung base nodules largest left lung base measuring 1.2 x 0.9 cm consistent with metastatic disease. No osseous destructive lesion. Left adrenal gland surrounded by bulky adenopathy and evaluation limited No right renal lesion or right adrenal lesion.

## 2014-07-01 ENCOUNTER — Telehealth: Payer: Self-pay | Admitting: Oncology

## 2014-07-01 NOTE — Telephone Encounter (Signed)
NEW PATIENT APPT-S/W PATIENT WIFE AND GAVE NP APPT FOR 05/19 @ 10:30 W/DR. [place] DX- CARCINOMA, RENAL CELL, LEFT

## 2014-07-02 ENCOUNTER — Telehealth: Payer: Self-pay | Admitting: *Deleted

## 2014-07-02 DIAGNOSIS — M545 Low back pain, unspecified: Secondary | ICD-10-CM

## 2014-07-02 NOTE — Telephone Encounter (Signed)
Pt called and stated he is still in lots of pain and was wondering if his Oxycodone dosage could be increased to help with the pain.  He states Dr. Lorelei Pont knows what is going on.

## 2014-07-02 NOTE — Telephone Encounter (Signed)
The medication is actually Hydrocodone 5-325 not Oxycodone.

## 2014-07-02 NOTE — Telephone Encounter (Signed)
Called him- let him know he can take 2 tabs every 6 hours if needed.  Will write an rx for oxycodone and leave for him tomorrow am.

## 2014-07-03 MED ORDER — OXYCODONE-ACETAMINOPHEN 5-325 MG PO TABS: 1.0000 | ORAL_TABLET | Freq: Four times a day (QID) | ORAL | Status: DC | PRN

## 2014-07-08 ENCOUNTER — Telehealth: Payer: Self-pay

## 2014-07-08 DIAGNOSIS — M545 Low back pain, unspecified: Secondary | ICD-10-CM

## 2014-07-08 MED ORDER — OXYCODONE-ACETAMINOPHEN 5-325 MG PO TABS: 1.0000 | ORAL_TABLET | Freq: Four times a day (QID) | ORAL | Status: DC | PRN

## 2014-07-08 NOTE — Telephone Encounter (Signed)
Patient is calling to request a medication refill for oxycodone. Please call when ready! [phone]

## 2014-07-08 NOTE — Telephone Encounter (Signed)
Done, called and discussed with him.  He has already been seen at [facility] by urology  rx is ready

## 2014-07-09 ENCOUNTER — Telehealth: Payer: Self-pay | Admitting: Oncology

## 2014-07-09 ENCOUNTER — Ambulatory Visit (HOSPITAL_BASED_OUTPATIENT_CLINIC_OR_DEPARTMENT_OTHER): Payer: 59 | Admitting: Oncology

## 2014-07-09 ENCOUNTER — Encounter: Payer: Self-pay | Admitting: Oncology

## 2014-07-09 ENCOUNTER — Other Ambulatory Visit: Payer: 59

## 2014-07-09 ENCOUNTER — Ambulatory Visit: Payer: 59

## 2014-07-09 VITALS — BP 145/72 | HR 86 | Temp 98.1°F | Resp 20 | Ht 70.0 in | Wt 197.7 lb

## 2014-07-09 DIAGNOSIS — N289 Disorder of kidney and ureter, unspecified: Secondary | ICD-10-CM

## 2014-07-09 DIAGNOSIS — C649 Malignant neoplasm of unspecified kidney, except renal pelvis: Secondary | ICD-10-CM

## 2014-07-09 DIAGNOSIS — R918 Other nonspecific abnormal finding of lung field: Secondary | ICD-10-CM

## 2014-07-09 NOTE — Consult Note (Signed)
Reason for Referral: Renal mass.   HPI: 59 year old gentleman currently living in this area where he lived the majority of his life. He has history of mild hypertension and hyperlipidemia but for the most part generally in good health. About a month ago he noted some back pain while he was trying to assist his mother to get into a wheelchair. Since that time, he had periodically have noted a left-sided back pain as well as intermittent abdominal pain. He also started developing abdominal fullness, early satiety and decrease in his appetite. He lost close to 18 pounds. He was evaluated by his primary care physician and underwent imaging studies including CT scan of the chest abdomen and pelvis. CT scan of the abdomen and pelvis done on 06/30/2014 showed a 11 x 10 x 12 cm mass in the superior aspect of the left kidney associated with bulky adenopathy. There is also adenopathy and versus omental involvement anterior to the liver. There is also adenopathy surrounding his left adrenal gland. CT scan of the chest on 06/29/2014 showed bilateral urinary nodules suspicious for metastatic disease. He also had noted increased cervical adenopathy. He was prescribed Percocet for his pain which have helped his symptoms. He was evaluated at [facility] urology and scheduled to have a nephrectomy on 07/14/2014. I was asked to comment about these findings. Clinically, he reports relatively fair. He does not report any headaches, blurry vision, syncope or seizures. He does not report any fevers, chills, sweats he does report weight loss and poor appetite. He does not report any chest pain, palpitation, leg edema or orthopnea. He does not report any cough, hemoptysis or wheezing. He does not report any nausea, vomiting he does report abdominal distention and early satiety. He does not report any constipation, diarrhea or hematochezia or melena. He does not report any frequency, urgency or hesitancy. He does  not report any hematuria or dysuria. He does not report any skeletal complaints. He does not report any arthralgias or myalgias. Remaining review of systems unremarkable.   Past Medical History  Diagnosis Date  . Arthritis   . Hypertension   . History of palpitations   . PAF (paroxysmal atrial fibrillation) 1996  . Hyperlipidemia   :  Past Surgical History  Procedure Laterality Date  . Total knee arthroplasty      right - 08/2012  :   Current outpatient prescriptions:  .  amLODipine-olmesartan (AZOR) 10-40 MG per tablet, Take 1 tablet by mouth daily., Disp: , Rfl:  .  aspirin EC 325 MG tablet, Take 325 mg by mouth daily., Disp: , Rfl:  .  atorvastatin (LIPITOR) 20 MG tablet, Take 1 tablet (20 mg total) by mouth daily., Disp: 90 tablet, Rfl: 3 .  colchicine 0.6 MG tablet, TAKE 2 AT ONSET OF GOUT FLAIR, THEN 1 TABLET 1 HOURS LATER. MAY CONTINUE 1-2 A DAY FOR DURATION, Disp: 70 tablet, Rfl: 5 .  metoprolol tartrate (LOPRESSOR) 25 MG tablet, Take 1 tablet (25 mg total) by mouth every morning., Disp: 90 tablet, Rfl: 3 .  Multiple Vitamin (MULTIVITAMIN) capsule, Take 1 capsule by mouth daily., Disp: , Rfl:  .  Omega-3 1000 MG CAPS, Take 1,000 mg by mouth daily., Disp: , Rfl:  .  omeprazole (PRILOSEC) 20 MG capsule, Take 20 mg by mouth daily., Disp: , Rfl:  .  oxyCODONE-acetaminophen (ROXICET) 5-325 MG per tablet, Take 1 tablet by mouth every 6 (six) hours as needed for severe pain., Disp: 30 tablet, Rfl: 0:  Allergies  Allergen Reactions  . Vytorin [Ezetimibe-Simvastatin] Other (See Comments)    myalgias  :  Family History  Problem Relation Age of Onset  . Hyperlipidemia Mother   . Heart disease Father     CABG  . Hyperlipidemia Sister   :  History   Social History  . Marital Status: Married    Spouse Name: N/A  . Number of Children: 2  . Years of Education: N/A   Occupational History  .      self-employed, [employer] History Main Topics  . Smoking  status: Former Smoker    Quit date: 10/31/1982  . Smokeless tobacco: Current User    Types: Snuff  . Alcohol Use: 4.2 oz/week    7 Standard drinks or equivalent per week  . Drug Use: No  . Sexual Activity: Not on file   Other Topics Concern  . Not on file   Social History Narrative  :  Pertinent items are noted in HPI.  Exam: ECOG 1 Blood pressure 145/72, pulse 86, temperature 98.1 F (36.7 C), temperature source Oral, resp. rate 20, height 5\' 10"  (1.778 m), weight 197 lb 11.2 oz (89.676 kg), SpO2 99 %. General appearance: alert and cooperative Head: Normocephalic, without obvious abnormality Throat: lips, mucosa, and tongue normal; teeth and gums normal Neck: no adenopathy Back: negative Resp: clear to auscultation bilaterally Chest wall: no tenderness Cardio: regular rate and rhythm, S1, S2 normal, no murmur, click, rub or gallop GI: soft, non-tender; bowel sounds normal; no masses,  no organomegaly Extremities: extremities normal, atraumatic, no cyanosis or edema Pulses: 2+ and symmetric Skin: Skin color, texture, turgor normal. No rashes or lesions Lymph nodes: Bilateral supraclavicular adenopathy noted more left than right.     Dg Chest 2 View  06/29/2014   CLINICAL DATA:  Chest pain and shortness of breath  EXAM: CHEST  2 VIEW  COMPARISON:  October 31, 2005  FINDINGS: There is no edema or consolidation. Heart size and pulmonary vascularity are normal. No adenopathy. No pneumothorax. There is postoperative change in the lower cervical spine region.  IMPRESSION: No edema or consolidation.   Electronically Signed   By: Lowella Grip III M.D.   On: 06/29/2014 16:01   Ct Chest W Contrast  06/29/2014   CLINICAL DATA:  Three day history of swollen left supraclavicular lymph node. Initial encounter.  EXAM: CT CHEST WITH CONTRAST  TECHNIQUE: Multidetector CT imaging of the chest was performed during intravenous contrast administration.  CONTRAST:  80mL OMNIPAQUE IOHEXOL 300  MG/ML  SOLN  COMPARISON:  None.  FINDINGS: Mediastinum / Lymph Nodes: Although incompletely visualized, there is a 4.3 x 3.8 cm soft tissue lesion in the left supraclavicular area, compatible with lymph node. No evidence for axillary lymphadenopathy. 16 mm short axis high left paratracheal lymph node is associated with other smaller high left paratracheal nodes. 14 mm short axis mid paraesophageal lymph node is seen on image 25 series 2. No evidence for hilar lymphadenopathy. Heart size is normal. Coronary artery calcification is noted. No pericardial effusion.  Lungs / Pleura: 7 mm left upper lobe pulmonary nodule is seen on image 27 series 5. 8 mm left lower lobe pulmonary nodule seen on image 46 and another left lower lobe pulmonary nodule measured 10 mm single image 50. A few other scattered smaller lung nodules are identified bilaterally.  MSK / Soft Tissues: Bone windows reveal no worrisome lytic or sclerotic osseous lesions.  Upper Abdomen: Bulky lymphadenopathy is seen  in the gastrohepatic and hepatoduodenal ligaments. There is also bulky retroperitoneal lymphadenopathy. Index lymph node in the gastrohepatic ligament measures 5.1 x 3.7 cm in shows central necrosis. A large heterogeneously enhancing mass replaces the upper pole of the left kidney, incompletely visualized. Multiple left perinephric nodules are associated.  IMPRESSION: 1. Large heterogeneously enhancing incompletely visualized mass replaces upper pole of the left kidney and is highly suspicious for renal cell carcinoma. This is associated with bulky upper abdominal lymphadenopathy consistent with metastatic disease. Dedicated CT scanning of the abdomen and pelvis with oral and IV contrast would be helpful to further evaluate. 2. Bulky left supraclavicular lymphadenopathy with smaller lymphadenopathy identified in the mediastinum. 3. Scattered small pulmonary nodules, also suspicious for metastatic involvement. 4. These results will be called to  the ordering clinician or representative by the Radiologist Assistant, and communication documented in the PACS or zVision Dashboard.   Electronically Signed   By: Misty Stanley M.D.   On: 06/29/2014 16:31   Ct Abdomen Pelvis W Contrast  06/30/2014   CLINICAL DATA:  59 year old male with abnormal chest CT raising possibility of renal cell carcinoma. Subsequent encounter.  EXAM: CT ABDOMEN AND PELVIS WITH CONTRAST  TECHNIQUE: Multidetector CT imaging of the abdomen and pelvis was performed using the standard protocol following bolus administration of intravenous contrast.  CONTRAST:  133mL OMNIPAQUE IOHEXOL 300 MG/ML  SOLN  COMPARISON:  06/29/2014 chest CT. No comparison CT scan of the abdomen and pelvis.  FINDINGS: 11 x 10 x 12 cm bulky lobulated mass replaces majority of the mid to superior aspect of the left kidney most consistent with renal cell carcinoma.  Marked surrounding bulky adenopathy surrounds and envelops the abdominal aorta and left renal artery. Adenopathy partially surrounds and anteriorly displaces the right renal artery. This bulky adenopathy causes anterior displacement and narrowing of the left renal vein but without tumor thrombus detected. Bulky adenopathy surrounds the celiac axis and peripancreatic region. Less bulky adenopathy retrocrural and lower left para-aortic region.  Adenopathy versus low mental metastatic 2 cm lobulated mass anterior to the liver. Small epicardial lymph nodes.  Lung base nodules largest left lung base measuring 1.2 x 0.9 cm consistent with metastatic disease.  No osseous destructive lesion.  Focal fatty infiltration liver within the fissure for the ligamentum teres without worrisome hepatic lesion. No focal splenic lesion. No primary pancreatic lesion seen separate from bulky adenopathy. No right renal lesion or right adrenal lesion. Left adrenal gland surrounded by bulky adenopathy and evaluation limited.  No calcified gallstones.  Dense bile incidentally noted.   No primary bowel abnormality. Stomach under distended and evaluation slightly limited.  Noncontrast filled views the urinary bladder without primary mass. Prostate gland within the range of normal limits.  Atherosclerotic type changes coronary arteries, abdominal aorta (with ectasia but without focal aneurysm) and involving abdominal aortic branch vessels including iliac arteries and femoral arteries  IMPRESSION: 11 x 10 x 12 cm bulky lobulated mass replaces majority of the mid to superior aspect of the left kidney most consistent with renal cell carcinoma.  Marked surrounding bulky adenopathy surrounds and envelops the abdominal aorta and left renal artery. Adenopathy partially surrounds and anteriorly displaces the right renal artery. This bulky adenopathy causes anterior displacement and narrowing of the left renal vein but without tumor thrombus detected. Bulky adenopathy surrounds the celiac axis and peripancreatic region. Less bulky adenopathy retrocrural and lower left para-aortic region.  Adenopathy versus omental metastatic 2 cm lobulated mass anterior to the liver. Small epicardial lymph  nodes.  Lung base nodules largest left lung base measuring 1.2 x 0.9 cm consistent with metastatic disease.  No osseous destructive lesion.  Left adrenal gland surrounded by bulky adenopathy and evaluation limited  No right renal lesion or right adrenal lesion.   Electronically Signed   By: Genia Del M.D.   On: 06/30/2014 13:43    Assessment and Plan:    59 year old gentleman with the following issues:  1. Left renal mass presented with a 11 x 10 x 12 cm arising from the mid to superior aspect of the left kidney. This is associated with bulky adenopathy as well as bilateral pulmonary nodules. The differential diagnosis was discussed with the patient today. This most likely represents a renal cell carcinoma with metastatic disease as detailed above. Other consideration would be transitional cell carcinoma of the  renal pelvis which is less likely.  The natural course of this disease was discussed with the patient and his family. Assuming we are dealing with renal cell carcinoma debulking surgery would be very appropriate at this time. Given his age and excellent performance status obtaining a tissue biopsy as well as debulking his tumor might offer the best option of palliating his symptoms moving forward.  After his surgery which is scheduled for 07/14/2014 he will require systemic therapy. The systemic therapy will be dictated by his pathology. If we are dealing with clear cell renal cell carcinoma, options would be tyrosine kinase inhibitors of VEGF, immunotherapy in the form of PD-1 antibody, PD, L1 antibody as well as high-dose interleukin-2. These options will be discussed further depending on his final pathology.  Other modalities of treatment such as radiation therapy can be used to palliate any pain in the supraclavicular area or neck pain.  He will probably will require imaging studies of the brain to rule out brain metastasis. He does not have any clinical signs or symptoms of that but certainly at risk given the aggressive nature of his cancer.  He understands surgery and systemic therapy would be palliative. He understands that there is no curative option at this point and his prognosis will be determined by how he responds to surgery and systemic therapy.  All his questions were answered today to his satisfaction.  2. Pain: His pain has been reasonably controlled with Percocet. Is predominantly in his back and his neck area.  3. Follow-up: I have arranged for him to return to clinic after his surgery and a brief recovery period.

## 2014-07-09 NOTE — Progress Notes (Signed)
Please see consult note.  

## 2014-07-09 NOTE — Telephone Encounter (Signed)
Gave and pirnted appt sched and avs for pt for [employer]

## 2014-07-09 NOTE — Progress Notes (Signed)
Checked in new pt with no financial concerns.  Pt has my card if he needs assistance with his out of pocket cost for chemo if needed as well as for any billing questions or concerns.

## 2014-07-18 ENCOUNTER — Telehealth: Payer: Self-pay | Admitting: *Deleted

## 2014-07-18 DIAGNOSIS — M545 Low back pain, unspecified: Secondary | ICD-10-CM

## 2014-07-18 NOTE — Telephone Encounter (Signed)
He had his nephrectomy this past week.  He is being treated with oxycodone 5mg ; 1-2 every 4 hours. I will refill this for him next week

## 2014-07-18 NOTE — Telephone Encounter (Signed)
Pt would like for Dr. Lorelei Pont to give him a call at [phone] so that he may get his medication filled here in [place] instead of going to [place].  He states that he just had surgery last Tuesday.

## 2014-07-20 ENCOUNTER — Telehealth: Payer: Self-pay

## 2014-07-20 MED ORDER — OXYCODONE-ACETAMINOPHEN 5-325 MG PO TABS: 1.0000 | ORAL_TABLET | Freq: Four times a day (QID) | ORAL | Status: DC | PRN

## 2014-07-20 NOTE — Telephone Encounter (Signed)
Patient called in again stating that he needs an answer before 4pm today because he wont have anyone to come and get it after that and he really needs his meds. I told him that Dr. Lorelei Pont was seeing patients and that she would do the best she could to get back with him. Also let him know that we are open until 4pm today if he wanted to come in and been see to get the Rx sooner, he declined.

## 2014-07-20 NOTE — Telephone Encounter (Signed)
Did RF  Meds ordered this encounter  Medications  . DISCONTD: oxyCODONE-acetaminophen (ROXICET) 5-325 MG per tablet    Sig: Take 1 tablet by mouth every 6 (six) hours as needed for severe pain.    Dispense:  45 tablet    Refill:  0  . oxyCODONE-acetaminophen (ROXICET) 5-325 MG per tablet    Sig: Take 1-2 tablets by mouth every 6 (six) hours as needed for severe pain.    Dispense:  45 tablet    Refill:  0   Gave rx for oxycodone 1-2 every 6 hours, #45

## 2014-07-20 NOTE — Telephone Encounter (Signed)
Patient has called again requesting medications/ I told him I will let you know he has called again

## 2014-07-21 NOTE — Telephone Encounter (Signed)
Rx ready to pick up. Called pt to let him know. Left a detailed message.

## 2014-07-27 ENCOUNTER — Telehealth: Payer: Self-pay

## 2014-07-27 ENCOUNTER — Telehealth: Payer: Self-pay | Admitting: *Deleted

## 2014-07-27 DIAGNOSIS — M545 Low back pain, unspecified: Secondary | ICD-10-CM

## 2014-07-27 MED ORDER — OXYCODONE-ACETAMINOPHEN 10-325 MG PO TABS
1.0000 | ORAL_TABLET | Freq: Four times a day (QID) | ORAL | Status: DC | PRN
Start: 2014-07-27 — End: 2014-07-27

## 2014-07-27 MED ORDER — OXYCODONE-ACETAMINOPHEN 10-325 MG PO TABS: 1.0000 | ORAL_TABLET | Freq: Four times a day (QID) | ORAL | Status: DC | PRN

## 2014-07-27 NOTE — Telephone Encounter (Signed)
Pt called back to check status of message. I advised that his message had been sent to Dr Lorelei Pont, but needs to allow 24-72 hrs for RF, esp for narcotics. Pt advised that his pain is getting more severe and he is having to take 2 tablets each time. He wanted to talk to Dr Lorelei Pont about getting the Rx changed to the 10 mg tablets instead. He advised that he is still waiting for call to get in to see oncologist but expects to hear in the next couple of days.

## 2014-07-27 NOTE — Telephone Encounter (Signed)
-----   Message from Wyatt Portela, MD sent at 07/27/2014  4:43 PM EDT ----- Thanks for the update. I do agree with you that his cancer volume might not be on par with his pain level. However, he just had surgery and that might be his cause of pain.  I don't have all the details of his surgery yet but will for sure have them when I see him next week. I will address that with him.   He will need staging workup in any case and I will address that as well.  I hope that helps.  Thanks,  Roxy Cedar. ----- Message -----    From: Darreld Mclean, MD    Sent: 07/27/2014   3:39 PM      To: Wyatt Portela, MD  Dear Dr. Alen Blew, I am Gago's PCP and am treating his pain related to his recent renal cancer. I have become concerned about the level of pain that he seems to have and wanted to touch base with you about how much pain you would expect in his situation.   He does not have bone mets as far as I know.  However he has been requiring oxycodone, and states that he needs to take 10 mg every 4-6 hours to control his pain.  Given the extreme stress he must be under, I wanted to make sure that he would be expected to have significant pain with his disease and that he is not mis-using this medication.  I have spoken with him and he assures me that he is having pain requiring this level of analgesia.   Thanks so much JC

## 2014-07-27 NOTE — Telephone Encounter (Signed)
I last gave him #45 oxycodone on 5/30- this would mean that he last had an rx 8 days ago and is using 5.6 tablets a day.  Will send a staff message to his oncologist as I am not sure if he would be expected to have this level of pain as he does not have bone mets as far as I konw.  Called but Dr. Alen Blew is not in today.  In the meantime will go ahead and refill, change to 10 mg oxycodone assuming that his pain is legitimate; however I am concerned that there may be some element of other stressors (namely his recent possibly terminal cancer dx) which may be driving him to use more narcotics.   Called him to discuss- he reports that using the pain medication gives him enough relief and energy that he can do his activities, denies using them for other reasons than for pain.    Meds ordered this encounter  Medications  . oxyCODONE-acetaminophen (PERCOCET) 10-325 MG per tablet    Sig: Take 1 tablet by mouth every 6 (six) hours as needed for pain.    Dispense:  60 tablet    Refill:  0

## 2014-07-27 NOTE — Addendum Note (Signed)
Addended by: Lamar Blinks C on: 07/27/2014 03:44 PM   Modules accepted: Orders

## 2014-07-27 NOTE — Telephone Encounter (Signed)
Pts sister inlaw picked up rx

## 2014-07-27 NOTE — Telephone Encounter (Signed)
Patient requesting refill for oxycodone. Please call at [phone]

## 2014-07-28 NOTE — Telephone Encounter (Signed)
Dr copland--

## 2014-08-05 ENCOUNTER — Telehealth: Payer: Self-pay | Admitting: Oncology

## 2014-08-05 NOTE — Telephone Encounter (Signed)
Lft msg for pt's wife Maudie Mercury who called and left msg to cancel pt's apt with MD on 06/16.Marland Kitchen... KJ

## 2014-08-06 ENCOUNTER — Ambulatory Visit: Payer: 59 | Admitting: Oncology

## 2014-08-06 ENCOUNTER — Telehealth: Payer: Self-pay

## 2014-08-06 NOTE — Telephone Encounter (Signed)
Pt states his oncologist states his hydrocodone needs to not have tylenol in it. Oncologist is Dr. Billy Coast

## 2014-08-06 NOTE — Telephone Encounter (Signed)
Called and spoke with pt.  He reports that the oncologist at [facility] gave him "years, not months" which is good news to him.  They also rx him BID morphine 15 (I assume MS contin) and instructed him to change to oxycodone without tylenol to use prn.  He is in need of more oxycodone soon; I will refill this for him tomorrow, urged caution in adding the oxycodone to MS contin as he will not need as much oxy- he states understanding

## 2014-08-06 NOTE — Telephone Encounter (Signed)
[place]   REFILL REQUEST:  oxyCODONE-acetaminophen (PERCOCET) 10-325 MG per tablet   Wife Joelene Millin  [phone]

## 2014-08-07 ENCOUNTER — Telehealth: Payer: Self-pay | Admitting: Family Medicine

## 2014-08-07 ENCOUNTER — Inpatient Hospital Stay (HOSPITAL_COMMUNITY)
Admission: EM | Admit: 2014-08-07 | Discharge: 2014-08-10 | DRG: 375 | Disposition: A | Discharge status: Other Acute Inpatient Hospital | Payer: 59 | Attending: Internal Medicine | Admitting: Internal Medicine

## 2014-08-07 ENCOUNTER — Telehealth: Payer: Self-pay

## 2014-08-07 ENCOUNTER — Encounter (HOSPITAL_COMMUNITY): Payer: Self-pay | Admitting: Oncology

## 2014-08-07 DIAGNOSIS — Z905 Acquired absence of kidney: Secondary | ICD-10-CM | POA: Diagnosis present

## 2014-08-07 DIAGNOSIS — C786 Secondary malignant neoplasm of retroperitoneum and peritoneum: Secondary | ICD-10-CM | POA: Diagnosis not present

## 2014-08-07 DIAGNOSIS — F17220 Nicotine dependence, chewing tobacco, uncomplicated: Secondary | ICD-10-CM | POA: Diagnosis present

## 2014-08-07 DIAGNOSIS — K5900 Constipation, unspecified: Secondary | ICD-10-CM | POA: Diagnosis present

## 2014-08-07 DIAGNOSIS — I10 Essential (primary) hypertension: Secondary | ICD-10-CM | POA: Diagnosis present

## 2014-08-07 DIAGNOSIS — I480 Paroxysmal atrial fibrillation: Secondary | ICD-10-CM | POA: Diagnosis present

## 2014-08-07 DIAGNOSIS — E785 Hyperlipidemia, unspecified: Secondary | ICD-10-CM | POA: Diagnosis present

## 2014-08-07 DIAGNOSIS — Z7982 Long term (current) use of aspirin: Secondary | ICD-10-CM

## 2014-08-07 DIAGNOSIS — M1711 Unilateral primary osteoarthritis, right knee: Secondary | ICD-10-CM | POA: Diagnosis present

## 2014-08-07 DIAGNOSIS — M109 Gout, unspecified: Secondary | ICD-10-CM | POA: Diagnosis present

## 2014-08-07 DIAGNOSIS — R109 Unspecified abdominal pain: Secondary | ICD-10-CM | POA: Diagnosis present

## 2014-08-07 DIAGNOSIS — Z96651 Presence of right artificial knee joint: Secondary | ICD-10-CM | POA: Diagnosis present

## 2014-08-07 DIAGNOSIS — Z8249 Family history of ischemic heart disease and other diseases of the circulatory system: Secondary | ICD-10-CM

## 2014-08-07 DIAGNOSIS — C649 Malignant neoplasm of unspecified kidney, except renal pelvis: Secondary | ICD-10-CM | POA: Diagnosis present

## 2014-08-07 DIAGNOSIS — C642 Malignant neoplasm of left kidney, except renal pelvis: Secondary | ICD-10-CM | POA: Diagnosis present

## 2014-08-07 DIAGNOSIS — K219 Gastro-esophageal reflux disease without esophagitis: Secondary | ICD-10-CM | POA: Diagnosis present

## 2014-08-07 HISTORY — DX: Malignant (primary) neoplasm, unspecified: C80.1

## 2014-08-07 LAB — CBC WITH DIFFERENTIAL/PLATELET
Basophils Absolute: 0 10*3/uL (ref 0.0–0.1)
Basophils Relative: 0 % (ref 0–1)
EOS ABS: 0 10*3/uL (ref 0.0–0.7)
EOS PCT: 0 % (ref 0–5)
HEMATOCRIT: 33 % — AB (ref 39.0–52.0)
HEMOGLOBIN: 10.3 g/dL — AB (ref 13.0–17.0)
LYMPHS ABS: 0.9 10*3/uL (ref 0.7–4.0)
Lymphocytes Relative: 9 % — ABNORMAL LOW (ref 12–46)
MCH: 26.5 pg (ref 26.0–34.0)
MCHC: 31.2 g/dL (ref 30.0–36.0)
MCV: 84.8 fL (ref 78.0–100.0)
MONO ABS: 0.9 10*3/uL (ref 0.1–1.0)
Monocytes Relative: 9 % (ref 3–12)
Neutro Abs: 7.7 10*3/uL (ref 1.7–7.7)
Neutrophils Relative %: 82 % — ABNORMAL HIGH (ref 43–77)
Platelets: 287 10*3/uL (ref 150–400)
RBC: 3.89 MIL/uL — AB (ref 4.22–5.81)
RDW: 14.5 % (ref 11.5–15.5)
WBC: 9.5 10*3/uL (ref 4.0–10.5)

## 2014-08-07 MED ORDER — OXYCODONE HCL 5 MG PO CAPS: 5.0000 mg | ORAL_CAPSULE | Freq: Four times a day (QID) | ORAL | Status: DC | PRN

## 2014-08-07 NOTE — Telephone Encounter (Signed)
Pt's daughter called in and states that the pt is having issues with constipation. He has taken an stool softener and a compository    [phone]

## 2014-08-07 NOTE — Telephone Encounter (Signed)
I suggest he come in to be seen. Looking through his history I don't see that he has ever had a colonoscopy and I see that he has metastatic kidney cancer. He should be evaluated.

## 2014-08-07 NOTE — Telephone Encounter (Signed)
I have called pt and taken care of this

## 2014-08-07 NOTE — Telephone Encounter (Signed)
Please advise 

## 2014-08-07 NOTE — ED Notes (Signed)
Per EMS pt w/ stage IV renal carcinoma.  Pt reports an increase in his opiate medication and has been continuing w/ stool softners. Yesterday was the first day pt was unable to have a BM.  Pt attempted enema w/o success.  Pt given 250 mcg of fentanyl en route decreasing pain from 10 to 7.

## 2014-08-07 NOTE — Telephone Encounter (Signed)
Pharm called w/? Because the Rx that they have to be filled is for Percocet w/tylenol, and they were under the understanding from pt that he was supposed to have the oxy w/out tyl. I spoke to Dr Lorelei Pont and she has already written this one and is in drawer for p/up. Called pharm back and advised and they will void the percocet Rx. Called pt's wife and advised the new Rx for plain Oxy is ready for p/up.

## 2014-08-07 NOTE — Telephone Encounter (Signed)
Patient's wife has a question about patient's OxyContin. She states that they are leaving to go out of town today and she would like a return call from Dr. Lorelei Pont.  [phone]

## 2014-08-07 NOTE — ED Notes (Signed)
Bed: RESB Expected date:  Expected time:  Means of arrival:  Comments: EMS CA liver/abd pain

## 2014-08-07 NOTE — Telephone Encounter (Signed)
Spoke with pt's daughter about the medication. She states his pain level is at a 10 now. She states he takes the medication every 4 hours so he ran out of medication. They have an Rx but the pharmacy will not fill it. Can we authorize refill or can we change Rx to 4 times a day. Please advise.

## 2014-08-08 ENCOUNTER — Emergency Department (HOSPITAL_COMMUNITY): Payer: 59

## 2014-08-08 DIAGNOSIS — C642 Malignant neoplasm of left kidney, except renal pelvis: Secondary | ICD-10-CM | POA: Diagnosis present

## 2014-08-08 DIAGNOSIS — R1084 Generalized abdominal pain: Secondary | ICD-10-CM

## 2014-08-08 DIAGNOSIS — R1010 Upper abdominal pain, unspecified: Secondary | ICD-10-CM | POA: Diagnosis not present

## 2014-08-08 DIAGNOSIS — Z96651 Presence of right artificial knee joint: Secondary | ICD-10-CM | POA: Diagnosis present

## 2014-08-08 DIAGNOSIS — Z8249 Family history of ischemic heart disease and other diseases of the circulatory system: Secondary | ICD-10-CM | POA: Diagnosis not present

## 2014-08-08 DIAGNOSIS — K5909 Other constipation: Secondary | ICD-10-CM

## 2014-08-08 DIAGNOSIS — Z7982 Long term (current) use of aspirin: Secondary | ICD-10-CM | POA: Diagnosis not present

## 2014-08-08 DIAGNOSIS — R109 Unspecified abdominal pain: Secondary | ICD-10-CM | POA: Diagnosis present

## 2014-08-08 DIAGNOSIS — K219 Gastro-esophageal reflux disease without esophagitis: Secondary | ICD-10-CM | POA: Diagnosis present

## 2014-08-08 DIAGNOSIS — C649 Malignant neoplasm of unspecified kidney, except renal pelvis: Secondary | ICD-10-CM | POA: Diagnosis present

## 2014-08-08 DIAGNOSIS — I10 Essential (primary) hypertension: Secondary | ICD-10-CM | POA: Diagnosis not present

## 2014-08-08 DIAGNOSIS — M1711 Unilateral primary osteoarthritis, right knee: Secondary | ICD-10-CM | POA: Diagnosis present

## 2014-08-08 DIAGNOSIS — K5900 Constipation, unspecified: Secondary | ICD-10-CM | POA: Diagnosis present

## 2014-08-08 DIAGNOSIS — C786 Secondary malignant neoplasm of retroperitoneum and peritoneum: Secondary | ICD-10-CM | POA: Diagnosis present

## 2014-08-08 DIAGNOSIS — E785 Hyperlipidemia, unspecified: Secondary | ICD-10-CM

## 2014-08-08 DIAGNOSIS — Z905 Acquired absence of kidney: Secondary | ICD-10-CM | POA: Diagnosis present

## 2014-08-08 DIAGNOSIS — F17220 Nicotine dependence, chewing tobacco, uncomplicated: Secondary | ICD-10-CM | POA: Diagnosis present

## 2014-08-08 DIAGNOSIS — I480 Paroxysmal atrial fibrillation: Secondary | ICD-10-CM | POA: Diagnosis present

## 2014-08-08 LAB — LIPASE, BLOOD: Lipase: 15 U/L — ABNORMAL LOW (ref 22–51)

## 2014-08-08 LAB — CBC
HCT: 32.1 % — ABNORMAL LOW (ref 39.0–52.0)
Hemoglobin: 9.8 g/dL — ABNORMAL LOW (ref 13.0–17.0)
MCH: 26 pg (ref 26.0–34.0)
MCHC: 30.5 g/dL (ref 30.0–36.0)
MCV: 85.1 fL (ref 78.0–100.0)
PLATELETS: 294 10*3/uL (ref 150–400)
RBC: 3.77 MIL/uL — AB (ref 4.22–5.81)
RDW: 14.6 % (ref 11.5–15.5)
WBC: 8.8 10*3/uL (ref 4.0–10.5)

## 2014-08-08 LAB — URINALYSIS, ROUTINE W REFLEX MICROSCOPIC
Bilirubin Urine: NEGATIVE
Glucose, UA: NEGATIVE mg/dL
HGB URINE DIPSTICK: NEGATIVE
Ketones, ur: NEGATIVE mg/dL
Leukocytes, UA: NEGATIVE
NITRITE: NEGATIVE
Protein, ur: NEGATIVE mg/dL
Specific Gravity, Urine: 1.008 (ref 1.005–1.030)
UROBILINOGEN UA: 0.2 mg/dL (ref 0.0–1.0)
pH: 6 (ref 5.0–8.0)

## 2014-08-08 LAB — COMPREHENSIVE METABOLIC PANEL
ALBUMIN: 2.8 g/dL — AB (ref 3.5–5.0)
ALK PHOS: 100 U/L (ref 38–126)
ALK PHOS: 97 U/L (ref 38–126)
ALT: 11 U/L — ABNORMAL LOW (ref 17–63)
ALT: 12 U/L — AB (ref 17–63)
ANION GAP: 7 (ref 5–15)
ANION GAP: 9 (ref 5–15)
AST: 34 U/L (ref 15–41)
AST: 37 U/L (ref 15–41)
Albumin: 2.9 g/dL — ABNORMAL LOW (ref 3.5–5.0)
BILIRUBIN TOTAL: 0.7 mg/dL (ref 0.3–1.2)
BUN: 16 mg/dL (ref 6–20)
BUN: 17 mg/dL (ref 6–20)
CHLORIDE: 97 mmol/L — AB (ref 101–111)
CHLORIDE: 98 mmol/L — AB (ref 101–111)
CO2: 25 mmol/L (ref 22–32)
CO2: 26 mmol/L (ref 22–32)
Calcium: 8.8 mg/dL — ABNORMAL LOW (ref 8.9–10.3)
Calcium: 9 mg/dL (ref 8.9–10.3)
Creatinine, Ser: 0.87 mg/dL (ref 0.61–1.24)
Creatinine, Ser: 0.99 mg/dL (ref 0.61–1.24)
GFR calc non Af Amer: >60 mL/min (ref >60)
GFR calc non Af Amer: >60 mL/min (ref >60)
GLUCOSE: 140 mg/dL — AB (ref 65–99)
GLUCOSE: 165 mg/dL — AB (ref 65–99)
POTASSIUM: 4.6 mmol/L (ref 3.5–5.1)
Potassium: 4.6 mmol/L (ref 3.5–5.1)
SODIUM: 132 mmol/L — AB (ref 135–145)
Sodium: 130 mmol/L — ABNORMAL LOW (ref 135–145)
TOTAL PROTEIN: 7.4 g/dL (ref 6.5–8.1)
TOTAL PROTEIN: 7.5 g/dL (ref 6.5–8.1)
Total Bilirubin: 0.7 mg/dL (ref 0.3–1.2)

## 2014-08-08 MED ORDER — ONDANSETRON HCL 4 MG/2ML IJ SOLN: 4.0000 mg | Freq: Four times a day (QID) | INTRAMUSCULAR | Status: DC | PRN

## 2014-08-08 MED ORDER — IOHEXOL 300 MG/ML  SOLN
50.0000 mL | Freq: Once | INTRAMUSCULAR | Status: AC | PRN
  Administered 2014-08-08: 50 mL via ORAL

## 2014-08-08 MED ORDER — DIPHENHYDRAMINE HCL 12.5 MG/5ML PO ELIX: 12.5000 mg | ORAL_SOLUTION | Freq: Four times a day (QID) | ORAL | Status: DC | PRN

## 2014-08-08 MED ORDER — MULTIVITAMINS PO CAPS: 1.0000 | ORAL_CAPSULE | Freq: Every day | ORAL | Status: DC

## 2014-08-08 MED ORDER — METOPROLOL TARTRATE 25 MG PO TABS
25.0000 mg | ORAL_TABLET | Freq: Two times a day (BID) | ORAL | Status: DC
  Administered 2014-08-08 – 2014-08-09 (×4): 25 mg via ORAL
  Filled 2014-08-08 (×4): qty 1

## 2014-08-08 MED ORDER — OMEGA-3-ACID ETHYL ESTERS 1 G PO CAPS
1.0000 g | ORAL_CAPSULE | Freq: Every day | ORAL | Status: DC
  Administered 2014-08-08 – 2014-08-09 (×2): 1 g via ORAL
  Filled 2014-08-08 (×2): qty 1

## 2014-08-08 MED ORDER — MORPHINE SULFATE ER 30 MG PO TBCR: 30.0000 mg | EXTENDED_RELEASE_TABLET | Freq: Two times a day (BID) | ORAL | Status: AC

## 2014-08-08 MED ORDER — HYDROMORPHONE HCL 1 MG/ML IJ SOLN: 1.0000 mg | INTRAMUSCULAR | Status: DC | PRN

## 2014-08-08 MED ORDER — HYDROMORPHONE 0.3 MG/ML IV SOLN
INTRAVENOUS | Status: DC
  Administered 2014-08-08: 1.8 mg via INTRAVENOUS
  Administered 2014-08-08: 2.1 mg via INTRAVENOUS
  Administered 2014-08-08: 06:00:00 via INTRAVENOUS
  Administered 2014-08-08: 4.68 mg via INTRAVENOUS
  Administered 2014-08-09: 02:00:00 via INTRAVENOUS
  Administered 2014-08-09: 2.4 mg via INTRAVENOUS
  Administered 2014-08-09: 20:00:00 via INTRAVENOUS
  Administered 2014-08-09 (×2): 2.7 mg via INTRAVENOUS
  Administered 2014-08-09: 3.3 mg via INTRAVENOUS
  Administered 2014-08-09: 3.9 mg via INTRAVENOUS
  Administered 2014-08-09: 2.4 mg via INTRAVENOUS
  Filled 2014-08-08 (×5): qty 25

## 2014-08-08 MED ORDER — SODIUM CHLORIDE 0.9 % IV BOLUS (SEPSIS)
1000.0000 mL | Freq: Once | INTRAVENOUS | Status: AC
Start: 2014-08-08 — End: 2014-08-08
  Administered 2014-08-08: 1000 mL via INTRAVENOUS

## 2014-08-08 MED ORDER — OXYCODONE HCL 5 MG PO TABS: 10.0000 mg | ORAL_TABLET | Freq: Four times a day (QID) | ORAL | Status: DC | PRN

## 2014-08-08 MED ORDER — HYDROMORPHONE HCL 2 MG/ML IJ SOLN
2.0000 mg | Freq: Once | INTRAMUSCULAR | Status: AC
  Administered 2014-08-08: 2 mg via INTRAVENOUS
  Filled 2014-08-08: qty 1

## 2014-08-08 MED ORDER — MORPHINE SULFATE ER 30 MG PO TBCR
30.0000 mg | EXTENDED_RELEASE_TABLET | Freq: Two times a day (BID) | ORAL | Status: DC
  Administered 2014-08-08 – 2014-08-09 (×4): 30 mg via ORAL
  Filled 2014-08-08 (×4): qty 1

## 2014-08-08 MED ORDER — SENNOSIDES-DOCUSATE SODIUM 8.6-50 MG PO TABS
2.0000 | ORAL_TABLET | Freq: Two times a day (BID) | ORAL | Status: DC
  Administered 2014-08-08 – 2014-08-09 (×4): 2 via ORAL
  Filled 2014-08-08 (×5): qty 2

## 2014-08-08 MED ORDER — HEPARIN SODIUM (PORCINE) 5000 UNIT/ML IJ SOLN
5000.0000 [IU] | Freq: Three times a day (TID) | INTRAMUSCULAR | Status: DC
  Administered 2014-08-08 – 2014-08-09 (×3): 5000 [IU] via SUBCUTANEOUS
  Filled 2014-08-08 (×7): qty 1

## 2014-08-08 MED ORDER — OMEGA-3 1000 MG PO CAPS: 1000.0000 mg | ORAL_CAPSULE | Freq: Every day | ORAL | Status: DC

## 2014-08-08 MED ORDER — SODIUM CHLORIDE 0.9 % IJ SOLN: 9.0000 mL | INTRAMUSCULAR | Status: DC | PRN

## 2014-08-08 MED ORDER — DIPHENHYDRAMINE HCL 50 MG/ML IJ SOLN: 12.5000 mg | Freq: Four times a day (QID) | INTRAMUSCULAR | Status: DC | PRN

## 2014-08-08 MED ORDER — ASPIRIN EC 325 MG PO TBEC
325.0000 mg | DELAYED_RELEASE_TABLET | Freq: Every day | ORAL | Status: DC
  Administered 2014-08-08 – 2014-08-09 (×2): 325 mg via ORAL
  Filled 2014-08-08 (×2): qty 1

## 2014-08-08 MED ORDER — HYDROMORPHONE HCL 1 MG/ML IJ SOLN
1.0000 mg | Freq: Once | INTRAMUSCULAR | Status: AC
  Administered 2014-08-08: 1 mg via INTRAVENOUS
  Filled 2014-08-08: qty 1

## 2014-08-08 MED ORDER — AMLODIPINE-OLMESARTAN 10-40 MG PO TABS
1.0000 | ORAL_TABLET | Freq: Every day | ORAL | Status: DC
Start: 2014-08-08 — End: 2014-08-08

## 2014-08-08 MED ORDER — POLYETHYLENE GLYCOL 3350 17 G PO PACK
17.0000 g | PACK | Freq: Two times a day (BID) | ORAL | Status: DC
Start: 2014-08-08 — End: 2014-08-10
  Administered 2014-08-08 – 2014-08-09 (×4): 17 g via ORAL
  Filled 2014-08-08 (×4): qty 1

## 2014-08-08 MED ORDER — SORBITOL 70 % SOLN
960.0000 mL | TOPICAL_OIL | Freq: Once | ORAL | Status: AC
  Administered 2014-08-08: 960 mL via RECTAL
  Filled 2014-08-08: qty 240

## 2014-08-08 MED ORDER — ONDANSETRON HCL 4 MG PO TABS: 4.0000 mg | ORAL_TABLET | Freq: Four times a day (QID) | ORAL | Status: DC | PRN

## 2014-08-08 MED ORDER — SORBITOL 70 % SOLN
30.0000 mL | Freq: Once | Status: AC
  Administered 2014-08-08: 30 mL via ORAL
  Filled 2014-08-08: qty 30

## 2014-08-08 MED ORDER — ADULT MULTIVITAMIN W/MINERALS CH
1.0000 | ORAL_TABLET | Freq: Every day | ORAL | Status: DC
  Administered 2014-08-08 – 2014-08-09 (×2): 1 via ORAL
  Filled 2014-08-08 (×2): qty 1

## 2014-08-08 MED ORDER — AMLODIPINE BESYLATE 10 MG PO TABS
10.0000 mg | ORAL_TABLET | Freq: Every day | ORAL | Status: DC
  Administered 2014-08-08 – 2014-08-09 (×2): 10 mg via ORAL
  Filled 2014-08-08 (×2): qty 1

## 2014-08-08 MED ORDER — ALUM & MAG HYDROXIDE-SIMETH 200-200-20 MG/5ML PO SUSP: 30.0000 mL | Freq: Four times a day (QID) | ORAL | Status: DC | PRN

## 2014-08-08 MED ORDER — SODIUM CHLORIDE 0.9 % IV SOLN
INTRAVENOUS | Status: DC
  Administered 2014-08-08: 06:00:00 via INTRAVENOUS

## 2014-08-08 MED ORDER — ATORVASTATIN CALCIUM 20 MG PO TABS
20.0000 mg | ORAL_TABLET | Freq: Every day | ORAL | Status: DC
  Administered 2014-08-08 – 2014-08-09 (×2): 20 mg via ORAL
  Filled 2014-08-08 (×2): qty 1

## 2014-08-08 MED ORDER — IRBESARTAN 300 MG PO TABS
300.0000 mg | ORAL_TABLET | Freq: Every day | ORAL | Status: DC
  Administered 2014-08-08 – 2014-08-09 (×2): 300 mg via ORAL
  Filled 2014-08-08 (×2): qty 1

## 2014-08-08 MED ORDER — PANTOPRAZOLE SODIUM 40 MG PO TBEC
40.0000 mg | DELAYED_RELEASE_TABLET | Freq: Every day | ORAL | Status: DC
  Administered 2014-08-09: 40 mg via ORAL
  Filled 2014-08-08 (×2): qty 1

## 2014-08-08 MED ORDER — NALOXONE HCL 0.4 MG/ML IJ SOLN: 0.4000 mg | INTRAMUSCULAR | Status: DC | PRN

## 2014-08-08 NOTE — Progress Notes (Signed)
PT Cancellation Note  Patient Details Name: Wayne Neal MRN: [id number] DOB: 23-Nov-1955   Cancelled Treatment:    Reason Eval/Treat Not Completed: Other (comment) (pt transferring to [facility])   [facility] 08/08/2014, 1:45 PM

## 2014-08-08 NOTE — Discharge Summary (Signed)
PATIENT DETAILS Name: Wayne Neal Age: 59 y.o. Sex: male Date of Birth: 1956-01-09 MRN: [id number]. Admitting Physician: Ivor Costa, MD ALP:[id number], MD  Admit Date: 08/07/2014 Discharge date: 08/08/2014  Recommendations for Outpatient Follow-up:  1. Optimize narcotic regime-for worsening abdominal pain  PRIMARY DISCHARGE DIAGNOSIS:  Principal Problem:   Abdominal pain Active Problems:   HTN (hypertension)   Osteoarthritis of right knee   Dyslipidemia   Gout   GERD (gastroesophageal reflux disease)   Renal cell carcinoma   Constipation      PAST MEDICAL HISTORY: Past Medical History  Diagnosis Date  . Arthritis   . Hypertension   . History of palpitations   . PAF (paroxysmal atrial fibrillation) 1996  . Hyperlipidemia   . Cancer     renal cell carcinoma    DISCHARGE MEDICATIONS: Current Discharge Medication List    START taking these medications   Details  morphine (MS CONTIN) 30 MG 12 hr tablet Take 1 tablet (30 mg total) by mouth every 12 (twelve) hours. Refills: 0      CONTINUE these medications which have NOT CHANGED   Details  amLODipine-olmesartan (AZOR) 10-40 MG per tablet Take 1 tablet by mouth daily.   Associated Diagnoses: Kidney cancer, primary, with metastasis from kidney to other site, unspecified laterality    aspirin EC 325 MG tablet Take 325 mg by mouth daily.   Associated Diagnoses: Kidney cancer, primary, with metastasis from kidney to other site, unspecified laterality    atorvastatin (LIPITOR) 20 MG tablet Take 1 tablet (20 mg total) by mouth daily. Qty: 90 tablet, Refills: 3    metoprolol tartrate (LOPRESSOR) 25 MG tablet Take 1 tablet (25 mg total) by mouth every morning. Qty: 90 tablet, Refills: 3    Multiple Vitamin (MULTIVITAMIN) capsule Take 1 capsule by mouth daily.   Associated Diagnoses: Kidney cancer, primary, with metastasis from kidney to other site, unspecified laterality    Omega-3 1000 MG CAPS Take 1,000  mg by mouth daily.   Associated Diagnoses: Kidney cancer, primary, with metastasis from kidney to other site, unspecified laterality    omeprazole (PRILOSEC) 20 MG capsule Take 20 mg by mouth daily.   Associated Diagnoses: Kidney cancer, primary, with metastasis from kidney to other site, unspecified laterality      STOP taking these medications     oxycodone (OXY-IR) 5 MG capsule      oxyCODONE-acetaminophen (PERCOCET) 10-325 MG per tablet      colchicine 0.6 MG tablet         ALLERGIES:   Allergies  Allergen Reactions  . Vytorin [Ezetimibe-Simvastatin] Other (See Comments)    myalgias    BRIEF HPI:  See H&P, Labs, Consult and Test reports for all details in brief, patient is a 59 y.o. male with PMH of hypertension, hyperlipidemia, GERD, gout, arthritis, recently diagnosed renal cell carcinoma (post status of left nephrectomy on 07/14/14), who presented with abdominal pain and constipation.  CONSULTATIONS:   None  PERTINENT RADIOLOGIC STUDIES: Ct Abdomen Pelvis Wo Contrast  08/08/2014   CLINICAL DATA:  Constipation for 2 days. History of stage IV metastatic renal cancer with increase in opiate medications recently. Abdominal pain.  EXAM: CT ABDOMEN AND PELVIS WITHOUT CONTRAST  TECHNIQUE: Multidetector CT imaging of the abdomen and pelvis was performed following the standard protocol without IV contrast.  COMPARISON:  06/30/2014  FINDINGS: Atelectasis in the lung bases. Pulmonary nodules in the left lung base, largest measuring 7 and 13 mm. These are likely metastatic. Small left  pleural effusion.  Left kidney appears to been resected. There is bulky lymphadenopathy along the celiac axis, retrocrural spaces, and throughout the retroperitoneum with periaortic and pericaval nodes present, measuring up to 8 cm diameter. This is consistent with known metastatic disease. Lymph nodes are measuring mildly larger than on prior study. There is no hydronephrosis or hydroureter on the right. No  renal stones identified.  The unenhanced appearance the liver, spleen, gallbladder, pancreas, right adrenal gland, and inferior vena cava is unremarkable. Calcification of the abdominal aorta without aneurysm. Aorta, IVC, and pancreas are displaced by the retroperitoneal lymphadenopathy. Stomach, small bowel, and colon are not abnormally distended. No free air or free fluid in the abdomen. Nodular soft tissue implantation along the right upper quadrant omentum measuring 3.6 cm. This is likely due to peritoneal metastasis and is new since previous study.  Pelvis: Prostate gland is not enlarged. Bladder wall is not thickened. Appendix is normal. Soft tissue mass in the anterior right lower quadrant measuring 2.8 cm may represent peritoneal or lymph node metastasis. This is enlarging since the previous study. No free or loculated pelvic fluid collections. Degenerative changes in the lumbar spine. No destructive bone lesions appreciated.  IMPRESSION: Surgical resection of the left kidney. Extensive metastatic disease throughout the retroperitoneal, celiac axis, and retrocrural lymph nodes with enlargement since previous study. New peritoneal implant in the right upper quadrant. Enlarging peritoneal implant or lymph node in the right lower quadrant. Nodules in the left lung base. No evidence of bowel obstruction or inflammation.   Electronically Signed   By: Lucienne Capers M.D.   On: 08/08/2014 01:49     PERTINENT LAB RESULTS: CBC:  Recent Labs  08/07/14 2343 08/08/14 0535  WBC 9.5 8.8  HGB 10.3* 9.8*  HCT 33.0* 32.1*  PLT 287 294   CMET CMP     Component Value Date/Time   NA 132* 08/08/2014 0535   K 4.6 08/08/2014 0535   CL 98* 08/08/2014 0535   CO2 25 08/08/2014 0535   GLUCOSE 140* 08/08/2014 0535   BUN 16 08/08/2014 0535   CREATININE 0.87 08/08/2014 0535   CREATININE 0.70 06/29/2014 1438   CALCIUM 9.0 08/08/2014 0535   PROT 7.4 08/08/2014 0535   ALBUMIN 2.8* 08/08/2014 0535   AST 34  08/08/2014 0535   ALT 12* 08/08/2014 0535   ALKPHOS 97 08/08/2014 0535   BILITOT 0.7 08/08/2014 0535   GFRNONAA >60 08/08/2014 0535   GFRNONAA >89 06/29/2014 1438   GFRAA >60 08/08/2014 0535   GFRAA >89 06/29/2014 1438    GFR Estimated Creatinine Clearance: 98.6 mL/min (by C-G formula based on Cr of 0.87).  Recent Labs  08/07/14 2343  LIPASE 15*   No results for input(s): CKTOTAL, CKMB, CKMBINDEX, TROPONINI in the last 72 hours. Invalid input(s): POCBNP No results for input(s): DDIMER in the last 72 hours. No results for input(s): HGBA1C in the last 72 hours. No results for input(s): CHOL, HDL, LDLCALC, TRIG, CHOLHDL, LDLDIRECT in the last 72 hours. No results for input(s): TSH, T4TOTAL, T3FREE, THYROIDAB in the last 72 hours.  Invalid input(s): FREET3 No results for input(s): VITAMINB12, FOLATE, FERRITIN, TIBC, IRON, RETICCTPCT in the last 72 hours. Coags: No results for input(s): INR in the last 72 hours.  Invalid input(s): PT Microbiology: No results found for this or any previous visit (from the past 240 hour(s)).   BRIEF HOSPITAL COURSE:  Abdominal pain:etiology likely new peritoneal mets/cancer burden. Pain continues in spite of BM .Belly is soft on exam.  CT Abdomen-shows worsening lymphadenopathy and new peritoneal mets-but no new acute abnormalities. No fever, leukocytosis to suggest an infectious process. No vomiting-eating regular food.Continue IV Dilaudid PCA-have spoken with Oncology at [facility]- will likely be transferred 6/19. Patient and family agreeable.  Active Problems: Consitpation:continue Miralax, Senokot. Follow  Hx of Metastatic Renal Cell Carcinoma:s/p left nephrectomy on 5/24-he follows with Oncology at [facility]-will likely be transferred to [facility] 6/19  HTN (hypertension):continue Metoprolol, Amlodipine and Avapro, follow BP trend  Dyslipidemia:cotninue statin  Gout:stable-no flare  GERD (gastroesophageal reflux disease):continue  PPI  TODAY-DAY OF DISCHARGE:  Subjective:   Burlene Arnt today has no headache,no chest abdominal pain,no new weakness tingling or numbness  Objective:   Blood pressure 155/80, pulse 85, temperature 97.2 F (36.2 C), temperature source Oral, resp. rate 16, height 5\' 11"  (1.803 m), weight 83.008 kg (183 lb), SpO2 100 %.  Intake/Output Summary (Last 24 hours) at 08/08/14 1227 Last data filed at 08/08/14 0616  Gross per 24 hour  Intake      0 ml  Output      1 ml  Net     -1 ml   Filed Weights   08/08/14 0614  Weight: 83.008 kg (183 lb)    Exam Awake Alert, Oriented *3, No new F.N deficits, Normal affect [place].AT,PERRAL Supple Neck,No JVD, No cervical lymphadenopathy appriciated.  Symmetrical Chest wall movement, Good air movement bilaterally, CTAB RRR,No Gallops,Rubs or new Murmurs, No Parasternal Heave +ve B.Sounds, Abd Soft, Non tender, No organomegaly appriciated, No rebound -guarding or rigidity. No Cyanosis, Clubbing or edema, No new Rash or bruise  DISCHARGE CONDITION: Stable  DISPOSITION: Transfer to [facility]:    Activity:  As tolerated   Diet recommendation: Heart Healthy diet  Discharge Instructions    Call MD for:  severe uncontrolled pain    Complete by:  As directed      Diet - low sodium heart healthy    Complete by:  As directed      Increase activity slowly    Complete by:  As directed            Follow-up Information    Schedule an appointment as soon as possible for a visit with Lamar Blinks, MD.   Specialty:  Family Medicine   Why:  As needed   Contact information:   [place] [id number] [phone]      Total Time spent on discharge equals  45 minutes.  SignedOren Binet 08/08/2014 12:27 PM

## 2014-08-08 NOTE — Telephone Encounter (Signed)
Left message to bring pt in to be seen.

## 2014-08-08 NOTE — Progress Notes (Signed)
PATIENT DETAILS Name: Wayne Neal Age: 59 y.o. Sex: male Date of Birth: 1955-10-14 Admit Date: 08/07/2014 Admitting Physician Ivor Costa, MD JIR:[id number],[id number], MD  Subjective: Abd pain controlled with Dilaudid PCA-pain at b/l flank area mostly  Assessment/Plan: Principal Problem: Abdominal pain:etiology likely new peritoneal mets/cancer burden. Pain continues in spite of BM today .Belly is soft on exam. CT Abdomen-shows worsening lymphadenopathy and new peritoneal mets-but no new acute abnormalities. No fever, leukocytosis to suggest an infectious process. No vomiting-eating regular food.Continue IV Dilaudid PCA-have spoken with Oncology at [facility] beds today-but will likely be transferred tomorrow. Patient and family agreeable.  Active Problems: Consitpation:continue Miralax, Senokot-small BM today after SMOG enema. Will give one dose of oral Sorbitol today. Follow  Hx of Metastatic Renal Cell Carcinoma:s/p left nephrectomy on 5/24-he follows with Oncology at [facility]-will likely be transferred to [facility] 6/19  HTN (hypertension):continue Metoprolol, Amlodipine and Avapro, follow BP trend  Dyslipidemia:cotninue statin  Gout:stable-no flare  GERD (gastroesophageal reflux disease):continue PPI  Disposition: Remain inpatient-transfer to [facility] when bed available  Antimicrobial agents  See below  Anti-infectives    None      DVT Prophylaxis: Prophylactic  Heparin  Code Status: Full code   Family Communication Spouse at bedside  Procedures: None  CONSULTS:  None  Time spent 35 minutes-Greater than 50% of this time was spent in counseling, explanation of diagnosis, planning of further management, and coordination of care.  MEDICATIONS: Scheduled Meds: . amLODipine  10 mg Oral Daily   And  . irbesartan  300 mg Oral Daily  . aspirin EC  325 mg Oral Daily  . atorvastatin  20 mg Oral Daily  . heparin  5,000 Units Subcutaneous 3 times  per day  . HYDROmorphone PCA 0.3 mg/mL   Intravenous 6 times per day  . metoprolol tartrate  25 mg Oral BID  . morphine  30 mg Oral Q12H  . multivitamin with minerals  1 tablet Oral Daily  . omega-3 acid ethyl esters  1 g Oral Daily  . pantoprazole  40 mg Oral Daily  . polyethylene glycol  17 g Oral BID  . senna-docusate  2 tablet Oral BID  . sorbitol  30 mL Oral Once   Continuous Infusions: . sodium chloride 125 mL/hr at 08/08/14 0621   PRN Meds:.alum & mag hydroxide-simeth, diphenhydrAMINE **OR** diphenhydrAMINE, naloxone **AND** sodium chloride, ondansetron **OR** ondansetron (ZOFRAN) IV    PHYSICAL EXAM: Vital signs in last 24 hours: Filed Vitals:   08/08/14 0500 08/08/14 0614 08/08/14 0621 08/08/14 0630  BP: 153/94 153/86  155/80  Pulse: 92 92  85  Temp:  97.2 F (36.2 C)    TempSrc:  Oral    Resp:  16 16 16   Height:  5\' 11"  (1.803 m)    Weight:  83.008 kg (183 lb)    SpO2: 97% 97% 97% 100%    Weight change:  Filed Weights   08/08/14 0614  Weight: 83.008 kg (183 lb)   Body mass index is 25.53 kg/(m^2).   Gen Exam: Awake and alert with clear speech.   Neck: Supple, No JVD.   Chest: B/L Clear.   CVS: S1 S2 Regular, no murmurs.  Abdomen: soft, BS +, non tender this am (but on PCA), non distended.  Extremities: no edema, lower extremities warm to touch. Neurologic: Non Focal.   Skin: No Rash.   Wounds: N/A.    Intake/Output from previous day:  Intake/Output Summary (Last 24 hours) at 08/08/14 1159 Last data filed at 08/08/14 0616  Gross per 24 hour  Intake      0 ml  Output      1 ml  Net     -1 ml     LAB RESULTS: CBC  Recent Labs Lab 08/07/14 2343 08/08/14 0535  WBC 9.5 8.8  HGB 10.3* 9.8*  HCT 33.0* 32.1*  PLT 287 294  MCV 84.8 85.1  MCH 26.5 26.0  MCHC 31.2 30.5  RDW 14.5 14.6  LYMPHSABS 0.9  --   MONOABS 0.9  --   EOSABS 0.0  --   BASOSABS 0.0  --     Chemistries   Recent Labs Lab 08/07/14 2343 08/08/14 0535  NA 130* 132*    K 4.6 4.6  CL 97* 98*  CO2 26 25  GLUCOSE 165* 140*  BUN 17 16  CREATININE 0.99 0.87  CALCIUM 8.8* 9.0    CBG: No results for input(s): GLUCAP in the last 168 hours.  GFR Estimated Creatinine Clearance: 98.6 mL/min (by C-G formula based on Cr of 0.87).  Coagulation profile No results for input(s): INR, PROTIME in the last 168 hours.  Cardiac Enzymes No results for input(s): CKMB, TROPONINI, MYOGLOBIN in the last 168 hours.  Invalid input(s): CK  Invalid input(s): POCBNP No results for input(s): DDIMER in the last 72 hours. No results for input(s): HGBA1C in the last 72 hours. No results for input(s): CHOL, HDL, LDLCALC, TRIG, CHOLHDL, LDLDIRECT in the last 72 hours. No results for input(s): TSH, T4TOTAL, T3FREE, THYROIDAB in the last 72 hours.  Invalid input(s): FREET3 No results for input(s): VITAMINB12, FOLATE, FERRITIN, TIBC, IRON, RETICCTPCT in the last 72 hours.  Recent Labs  08/07/14 2343  LIPASE 15*    Urine Studies No results for input(s): UHGB, CRYS in the last 72 hours.  Invalid input(s): UACOL, UAPR, USPG, UPH, UTP, UGL, UKET, UBIL, UNIT, UROB, ULEU, UEPI, UWBC, URBC, UBAC, CAST, UCOM, BILUA  MICROBIOLOGY: No results found for this or any previous visit (from the past 240 hour(s)).  RADIOLOGY STUDIES/RESULTS: Ct Abdomen Pelvis Wo Contrast  08/08/2014   CLINICAL DATA:  Constipation for 2 days. History of stage IV metastatic renal cancer with increase in opiate medications recently. Abdominal pain.  EXAM: CT ABDOMEN AND PELVIS WITHOUT CONTRAST  TECHNIQUE: Multidetector CT imaging of the abdomen and pelvis was performed following the standard protocol without IV contrast.  COMPARISON:  06/30/2014  FINDINGS: Atelectasis in the lung bases. Pulmonary nodules in the left lung base, largest measuring 7 and 13 mm. These are likely metastatic. Small left pleural effusion.  Left kidney appears to been resected. There is bulky lymphadenopathy along the celiac axis,  retrocrural spaces, and throughout the retroperitoneum with periaortic and pericaval nodes present, measuring up to 8 cm diameter. This is consistent with known metastatic disease. Lymph nodes are measuring mildly larger than on prior study. There is no hydronephrosis or hydroureter on the right. No renal stones identified.  The unenhanced appearance the liver, spleen, gallbladder, pancreas, right adrenal gland, and inferior vena cava is unremarkable. Calcification of the abdominal aorta without aneurysm. Aorta, IVC, and pancreas are displaced by the retroperitoneal lymphadenopathy. Stomach, small bowel, and colon are not abnormally distended. No free air or free fluid in the abdomen. Nodular soft tissue implantation along the right upper quadrant omentum measuring 3.6 cm. This is likely due to peritoneal metastasis and is new since previous study.  Pelvis: Prostate gland is not  enlarged. Bladder wall is not thickened. Appendix is normal. Soft tissue mass in the anterior right lower quadrant measuring 2.8 cm may represent peritoneal or lymph node metastasis. This is enlarging since the previous study. No free or loculated pelvic fluid collections. Degenerative changes in the lumbar spine. No destructive bone lesions appreciated.  IMPRESSION: Surgical resection of the left kidney. Extensive metastatic disease throughout the retroperitoneal, celiac axis, and retrocrural lymph nodes with enlargement since previous study. New peritoneal implant in the right upper quadrant. Enlarging peritoneal implant or lymph node in the right lower quadrant. Nodules in the left lung base. No evidence of bowel obstruction or inflammation.   Electronically Signed   By: Lucienne Capers M.D.   On: 08/08/2014 01:49    Oren Binet, MD  [facility] Pager:[phone]  If 7PM-7AM, please contact night-coverage [email or web address] Password [email or web address] 08/08/2014, 11:59 AM   LOS: 0 days

## 2014-08-08 NOTE — ED Provider Notes (Signed)
CSN: [id number]     Arrival date & time 08/07/14  2324 History   First MD Initiated Contact with Patient 08/08/14 0022     Chief Complaint  Patient presents with  . Abdominal Pain     (Consider location/radiation/quality/duration/timing/severity/associated sxs/prior Treatment) Patient is a 59 y.o. male presenting with abdominal pain.  Abdominal Pain Pain location:  Generalized Pain quality: aching   Pain radiates to:  Does not radiate Pain severity:  Severe Onset quality:  Gradual Duration:  1 day Timing:  Constant Progression:  Worsening Chronicity:  New Context: laxative use (change in pain meds at home, now hasn't had BM in two days despite stool softeners and laxatives.)   Context comment:  Left nephrectomy about 3 weeks ago Relieved by: fentanyl given by EMS. Worsened by:  Movement and palpation Associated symptoms: flatus (minimal today)   Associated symptoms: no fever, no nausea and no vomiting   Associated symptoms comment:  Sweats   Past Medical History  Diagnosis Date  . Arthritis   . Hypertension   . History of palpitations   . PAF (paroxysmal atrial fibrillation) 1996  . Hyperlipidemia   . Cancer     renal cell carcinoma   Past Surgical History  Procedure Laterality Date  . Total knee arthroplasty      right - 08/2012   Family History  Problem Relation Age of Onset  . Hyperlipidemia Mother   . Heart disease Father     CABG  . Hyperlipidemia Sister    History  Substance Use Topics  . Smoking status: Former Smoker    Quit date: 10/31/1982  . Smokeless tobacco: Current User    Types: Snuff  . Alcohol Use: 4.2 oz/week    7 Standard drinks or equivalent per week    Review of Systems  Constitutional: Negative for fever.  Gastrointestinal: Positive for abdominal pain and flatus (minimal today). Negative for nausea and vomiting.  All other systems reviewed and are negative.     Allergies  Vytorin  Home Medications   Prior to Admission  medications   Medication Sig Start Date End Date Taking? Authorizing Provider  amLODipine-olmesartan (AZOR) 10-40 MG per tablet Take 1 tablet by mouth daily. 04/29/12  Yes Historical Provider, MD  aspirin EC 325 MG tablet Take 325 mg by mouth daily.   Yes Historical Provider, MD  atorvastatin (LIPITOR) 20 MG tablet Take 1 tablet (20 mg total) by mouth daily. 11/20/13  Yes Pixie Casino, MD  metoprolol tartrate (LOPRESSOR) 25 MG tablet Take 1 tablet (25 mg total) by mouth every morning. 11/20/13  Yes Pixie Casino, MD  Multiple Vitamin (MULTIVITAMIN) capsule Take 1 capsule by mouth daily.   Yes Historical Provider, MD  Omega-3 1000 MG CAPS Take 1,000 mg by mouth daily.   Yes Historical Provider, MD  omeprazole (PRILOSEC) 20 MG capsule Take 20 mg by mouth daily. 07/09/14  Yes Historical Provider, MD  oxycodone (OXY-IR) 5 MG capsule Take 1 capsule (5 mg total) by mouth every 6 (six) hours as needed. Patient taking differently: Take 5 mg by mouth every 6 (six) hours as needed for pain.  08/07/14  Yes Gay Filler Copland, MD  oxyCODONE-acetaminophen (PERCOCET) 10-325 MG per tablet Take 1 tablet by mouth every 6 (six) hours as needed for pain. 07/27/14  Yes Jessica C Copland, MD  colchicine 0.6 MG tablet TAKE 2 AT ONSET OF GOUT FLAIR, THEN 1 TABLET 1 HOURS LATER. MAY CONTINUE 1-2 A DAY FOR DURATION 02/23/14  Chelle Jeffery, PA-C   BP 142/77 mmHg  Pulse 95  Temp(Src) 98.3 F (36.8 C) (Oral)  Resp 20  SpO2 95% Physical Exam  Constitutional: He is oriented to person, place, and time. He appears well-developed and well-nourished. No distress.  HENT:  Head: Normocephalic and atraumatic.  Mouth/Throat: Oropharynx is clear and moist.  Eyes: Conjunctivae are normal. Pupils are equal, round, and reactive to light. No scleral icterus.  Neck: Neck supple.  Cardiovascular: Normal rate, regular rhythm, normal heart sounds and intact distal pulses.   No murmur heard. Pulmonary/Chest: Effort normal and breath  sounds normal. No stridor. No respiratory distress. He has no wheezes. He has no rales.  Abdominal: Soft. He exhibits no distension. There is tenderness in the right lower quadrant, periumbilical area, suprapubic area and left lower quadrant. There is no rigidity, no rebound and no guarding.    Musculoskeletal: Normal range of motion. He exhibits no edema.  Neurological: He is alert and oriented to person, place, and time.  Skin: Skin is warm and dry. No rash noted.  Psychiatric: He has a normal mood and affect. His behavior is normal.  Nursing note and vitals reviewed.   ED Course  Procedures (including critical care time) Labs Review Labs Reviewed  CBC WITH DIFFERENTIAL/PLATELET - Abnormal; Notable for the following:    RBC 3.89 (*)    Hemoglobin 10.3 (*)    HCT 33.0 (*)    Neutrophils Relative % 82 (*)    Lymphocytes Relative 9 (*)    All other components within normal limits  COMPREHENSIVE METABOLIC PANEL - Abnormal; Notable for the following:    Sodium 130 (*)    Chloride 97 (*)    Glucose, Bld 165 (*)    Calcium 8.8 (*)    Albumin 2.9 (*)    ALT 11 (*)    All other components within normal limits  LIPASE, BLOOD - Abnormal; Notable for the following:    Lipase 15 (*)    All other components within normal limits  COMPREHENSIVE METABOLIC PANEL - Abnormal; Notable for the following:    Sodium 132 (*)    Chloride 98 (*)    Glucose, Bld 140 (*)    Albumin 2.8 (*)    ALT 12 (*)    All other components within normal limits  CBC - Abnormal; Notable for the following:    RBC 3.77 (*)    Hemoglobin 9.8 (*)    HCT 32.1 (*)    All other components within normal limits  URINALYSIS, ROUTINE W REFLEX MICROSCOPIC (NOT AT [facility])  CBC  BASIC METABOLIC PANEL    Imaging Review Ct Abdomen Pelvis Wo Contrast  08/08/2014   CLINICAL DATA:  Constipation for 2 days. History of stage IV metastatic renal cancer with increase in opiate medications recently. Abdominal pain.  EXAM: CT  ABDOMEN AND PELVIS WITHOUT CONTRAST  TECHNIQUE: Multidetector CT imaging of the abdomen and pelvis was performed following the standard protocol without IV contrast.  COMPARISON:  06/30/2014  FINDINGS: Atelectasis in the lung bases. Pulmonary nodules in the left lung base, largest measuring 7 and 13 mm. These are likely metastatic. Small left pleural effusion.  Left kidney appears to been resected. There is bulky lymphadenopathy along the celiac axis, retrocrural spaces, and throughout the retroperitoneum with periaortic and pericaval nodes present, measuring up to 8 cm diameter. This is consistent with known metastatic disease. Lymph nodes are measuring mildly larger than on prior study. There is no hydronephrosis or hydroureter on  the right. No renal stones identified.  The unenhanced appearance the liver, spleen, gallbladder, pancreas, right adrenal gland, and inferior vena cava is unremarkable. Calcification of the abdominal aorta without aneurysm. Aorta, IVC, and pancreas are displaced by the retroperitoneal lymphadenopathy. Stomach, small bowel, and colon are not abnormally distended. No free air or free fluid in the abdomen. Nodular soft tissue implantation along the right upper quadrant omentum measuring 3.6 cm. This is likely due to peritoneal metastasis and is new since previous study.  Pelvis: Prostate gland is not enlarged. Bladder wall is not thickened. Appendix is normal. Soft tissue mass in the anterior right lower quadrant measuring 2.8 cm may represent peritoneal or lymph node metastasis. This is enlarging since the previous study. No free or loculated pelvic fluid collections. Degenerative changes in the lumbar spine. No destructive bone lesions appreciated.  IMPRESSION: Surgical resection of the left kidney. Extensive metastatic disease throughout the retroperitoneal, celiac axis, and retrocrural lymph nodes with enlargement since previous study. New peritoneal implant in the right upper quadrant.  Enlarging peritoneal implant or lymph node in the right lower quadrant. Nodules in the left lung base. No evidence of bowel obstruction or inflammation.   Electronically Signed   By: Lucienne Capers M.D.   On: 08/08/2014 01:49     EKG Interpretation None      MDM   Final diagnoses:  Abdominal pain    Pain difficult to control despite multiple doses of IV dilaudid.  Admitted for further management.    Serita Grit, MD 08/08/14 [phone]

## 2014-08-08 NOTE — H&P (Addendum)
[facility] History and Physical  Wayne Neal TGG:[id number] DOB: Dec 27, 1955 DOA: 08/07/2014  Referring physician: ED physician PCP: Lamar Blinks, MD  Specialists:   Chief Complaint: Abdominal pain and constipation  HPI: Wayne Neal is a 59 y.o. male with PMH of hypertension, hyperlipidemia, GERD, gout, arthritis, recently diagnosed renal cell carcinoma (post status of left nephrectomy on 07/14/14), who presents with abdominal pain and constipation.  Patient reports that he has been constipated in the past 48 hours. He is taking stool softer, Colace without help. He was instructed by nurse to start MiraLAX, which he took once at 4:00 PM, still not have had bowel movement yet. He has diffuse abdominal pain. He is taking MS Contin 15 mg twice a day, and when necessary oxycodone currently, which does not control his pain. Patient does not have nausea, vomiting, diarrhea. No symptoms of UTI. No chest pain or shortness of breath.  In ED, patient was found to have lipase 15, negative urinalysis, WBC 9.5, no tachycardia, electrolytes okay. CT abdomen/pelvis showed extensive intraabdominal metastatic disease and new peritoneal implant in the right upper quadrant, also with nodules in the left lung base. No evidence of bowel obstruction or inflammation. Patient is admitted to inpatient for further evaluation and treatment.  Where does patient live?   At home    Can patient participate in ADLs?   Some   Review of Systems:   General: no fevers, chills, no changes in body weight, has poor appetite, has fatigue HEENT: no blurry vision, hearing changes or sore throat Pulm: no dyspnea, coughing, wheezing CV: no chest pain, palpitations Abd: no nausea, vomiting, has abdominal pain and constipation. No diarrhea. GU: no dysuria, burning on urination, increased urinary frequency, hematuria  Ext: no leg edema Neuro: no unilateral weakness, numbness, or tingling, no vision change or hearing  loss Skin: no rash MSK: No muscle spasm, no deformity, no limitation of range of movement in spin Heme: No easy bruising.  Travel history: No recent long distant travel.  Allergy:  Allergies  Allergen Reactions  . Vytorin [Ezetimibe-Simvastatin] Other (See Comments)    myalgias    Past Medical History  Diagnosis Date  . Arthritis   . Hypertension   . History of palpitations   . PAF (paroxysmal atrial fibrillation) 1996  . Hyperlipidemia   . Cancer     renal cell carcinoma    Past Surgical History  Procedure Laterality Date  . Total knee arthroplasty      right - 08/2012    Social History:  reports that he quit smoking about 31 years ago. His smokeless tobacco use includes Snuff. He reports that he drinks about 4.2 oz of alcohol per week. He reports that he does not use illicit drugs.  Family History:  Family History  Problem Relation Age of Onset  . Hyperlipidemia Mother   . Heart disease Father     CABG  . Hyperlipidemia Sister      Prior to Admission medications   Medication Sig Start Date End Date Taking? Authorizing Provider  amLODipine-olmesartan (AZOR) 10-40 MG per tablet Take 1 tablet by mouth daily. 04/29/12  Yes Historical Provider, MD  aspirin EC 325 MG tablet Take 325 mg by mouth daily.   Yes Historical Provider, MD  atorvastatin (LIPITOR) 20 MG tablet Take 1 tablet (20 mg total) by mouth daily. 11/20/13  Yes Pixie Casino, MD  metoprolol tartrate (LOPRESSOR) 25 MG tablet Take 1 tablet (25 mg total) by mouth every morning. 11/20/13  Yes Pixie Casino, MD  Multiple Vitamin (MULTIVITAMIN) capsule Take 1 capsule by mouth daily.   Yes Historical Provider, MD  Omega-3 1000 MG CAPS Take 1,000 mg by mouth daily.   Yes Historical Provider, MD  omeprazole (PRILOSEC) 20 MG capsule Take 20 mg by mouth daily. 07/09/14  Yes Historical Provider, MD  oxycodone (OXY-IR) 5 MG capsule Take 1 capsule (5 mg total) by mouth every 6 (six) hours as needed. Patient taking  differently: Take 5 mg by mouth every 6 (six) hours as needed for pain.  08/07/14  Yes Gay Filler Copland, MD  oxyCODONE-acetaminophen (PERCOCET) 10-325 MG per tablet Take 1 tablet by mouth every 6 (six) hours as needed for pain. 07/27/14  Yes Gay Filler Copland, MD  colchicine 0.6 MG tablet TAKE 2 AT ONSET OF GOUT FLAIR, THEN 1 TABLET 1 HOURS LATER. MAY CONTINUE 1-2 A DAY FOR DURATION 02/23/14   Harrison Mons, PA-C    Physical Exam: Filed Vitals:   08/08/14 0300 08/08/14 0330 08/08/14 0400 08/08/14 0430  BP: 138/75 171/87 179/99 155/95  Pulse: 83 83 107 93  Temp:      TempSrc:      Resp: 16     SpO2: 96% 97% 98% 99%   General: Not in acute distress. Dry mucus and membrane. HEENT:       Eyes: PERRL, EOMI, no scleral icterus.       ENT: No discharge from the ears and nose, no pharynx injection, no tonsillar enlargement.        Neck: No JVD, no bruit, no mass felt. Heme: No neck lymph node enlargement. Cardiac: S1/S2, RRR, No murmurs, No gallops or rubs. Pulm: No rales, wheezing, rhonchi or rubs. Abd: Soft, nondistended, diffused tenderness, no rebound pain, BS present. Surgical site over left abdomen healing well. Ext: No pitting leg edema bilaterally. 2+DP/PT pulse bilaterally. Musculoskeletal: No joint deformities, No joint redness or warmth, no limitation of ROM in spin. Skin: No rashes.  Neuro: Alert, oriented X3, cranial nerves II-XII grossly intact, muscle strength 5/5 in all extremities, sensation to light touch intact. Psych: Patient is not psychotic, no suicidal or hemocidal ideation.  Labs on Admission:  Basic Metabolic Panel:  Recent Labs Lab 08/07/14 2343  NA 130*  K 4.6  CL 97*  CO2 26  GLUCOSE 165*  BUN 17  CREATININE 0.99  CALCIUM 8.8*   Liver Function Tests:  Recent Labs Lab 08/07/14 2343  AST 37  ALT 11*  ALKPHOS 100  BILITOT 0.7  PROT 7.5  ALBUMIN 2.9*    Recent Labs Lab 08/07/14 2343  LIPASE 15*   No results for input(s): AMMONIA in the last  168 hours. CBC:  Recent Labs Lab 08/07/14 2343  WBC 9.5  NEUTROABS 7.7  HGB 10.3*  HCT 33.0*  MCV 84.8  PLT 287   Cardiac Enzymes: No results for input(s): CKTOTAL, CKMB, CKMBINDEX, TROPONINI in the last 168 hours.  BNP (last 3 results) No results for input(s): BNP in the last 8760 hours.  ProBNP (last 3 results) No results for input(s): PROBNP in the last 8760 hours.  CBG: No results for input(s): GLUCAP in the last 168 hours.  Radiological Exams on Admission: Ct Abdomen Pelvis Wo Contrast  08/08/2014   CLINICAL DATA:  Constipation for 2 days. History of stage IV metastatic renal cancer with increase in opiate medications recently. Abdominal pain.  EXAM: CT ABDOMEN AND PELVIS WITHOUT CONTRAST  TECHNIQUE: Multidetector CT imaging of the abdomen and pelvis was performed  following the standard protocol without IV contrast.  COMPARISON:  06/30/2014  FINDINGS: Atelectasis in the lung bases. Pulmonary nodules in the left lung base, largest measuring 7 and 13 mm. These are likely metastatic. Small left pleural effusion.  Left kidney appears to been resected. There is bulky lymphadenopathy along the celiac axis, retrocrural spaces, and throughout the retroperitoneum with periaortic and pericaval nodes present, measuring up to 8 cm diameter. This is consistent with known metastatic disease. Lymph nodes are measuring mildly larger than on prior study. There is no hydronephrosis or hydroureter on the right. No renal stones identified.  The unenhanced appearance the liver, spleen, gallbladder, pancreas, right adrenal gland, and inferior vena cava is unremarkable. Calcification of the abdominal aorta without aneurysm. Aorta, IVC, and pancreas are displaced by the retroperitoneal lymphadenopathy. Stomach, small bowel, and colon are not abnormally distended. No free air or free fluid in the abdomen. Nodular soft tissue implantation along the right upper quadrant omentum measuring 3.6 cm. This is likely  due to peritoneal metastasis and is new since previous study.  Pelvis: Prostate gland is not enlarged. Bladder wall is not thickened. Appendix is normal. Soft tissue mass in the anterior right lower quadrant measuring 2.8 cm may represent peritoneal or lymph node metastasis. This is enlarging since the previous study. No free or loculated pelvic fluid collections. Degenerative changes in the lumbar spine. No destructive bone lesions appreciated.  IMPRESSION: Surgical resection of the left kidney. Extensive metastatic disease throughout the retroperitoneal, celiac axis, and retrocrural lymph nodes with enlargement since previous study. New peritoneal implant in the right upper quadrant. Enlarging peritoneal implant or lymph node in the right lower quadrant. Nodules in the left lung base. No evidence of bowel obstruction or inflammation.   Electronically Signed   By: Lucienne Capers M.D.   On: 08/08/2014 01:49    EKG: Not done in ED, will get one.   Assessment/Plan Principal Problem:   Abdominal pain Active Problems:   HTN (hypertension)   Osteoarthritis of right knee   Dyslipidemia   Gout   GERD (gastroesophageal reflux disease)   Renal cell carcinoma   Constipation  Abdominal pain: this is most likely due to metastasized renal cell carcinoma as evidenced by CT scan.  Constipation may have also contributed. Lipase negative. No signs of infection. -will admit to med-surg bed -increase his oxycodone from 5 mg to 10 mg q6h prn -increased his MS contin from 15 to 30 mg bid -prn dilaudid -treat constipation as below  Addendum: Nurse reports that IV dilaudid dose not relive his abdominal pain. Patient has severe abdominal pain, 10/10 in severity.  -will start PCA protocol for better pain control. -Hold prn oxycodone and dilaudid  Consitpatiton: Secondary to narcotic pain medication use. -senokot 2 tab bid -Mirralax 17 gram bid  HTN: -continue amlodipine, metoprolol  HLD: Last LDL was 62  on 12/12/13 -Continue home medications: Lipitor  GERD: -Protonix  Gout: stable. 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 S/p of left nephrectomy by Dr. Felicie Morn at [facility] on 07/14/14. Patient used to see Dr. Alen Blew in [facility], but has switched her care to Dr. Ewell Poe in [place] seen on 08/05/14, planed to wait for 3 weeks after surgery, and then get CT scan and MRI on 08/26/14 to decide for next plan. -follow up with Dr. Ewell Poe  Atrial Fibrillation: Patient had an episode of atrial fibrillation in 1996 that spontaneously converted. CHA2DS2-VASc Score is 1, does  not need oral anticoagulant. Heart rate is well controlled.  -continue ASA and metoprolol   DVT ppx: SQ Heparin         Code Status: Full code (asked, pt is not ready to decide yet) Family Communication:  Yes, patient's daughter and son-in-law  at bed side Disposition Plan: Admit to inpatient   Date of Service 08/08/2014    Ivor Costa [facility] Pager [phone]  If 7PM-7AM, please contact night-coverage [email or web address] Password [facility] 08/08/2014, 4:52 AM

## 2014-08-09 ENCOUNTER — Inpatient Hospital Stay (HOSPITAL_COMMUNITY): Payer: 59

## 2014-08-09 DIAGNOSIS — R1010 Upper abdominal pain, unspecified: Secondary | ICD-10-CM

## 2014-08-09 DIAGNOSIS — I10 Essential (primary) hypertension: Secondary | ICD-10-CM

## 2014-08-09 DIAGNOSIS — K219 Gastro-esophageal reflux disease without esophagitis: Secondary | ICD-10-CM

## 2014-08-09 LAB — BASIC METABOLIC PANEL
Anion gap: 9 (ref 5–15)
BUN: 17 mg/dL (ref 6–20)
CALCIUM: 9.1 mg/dL (ref 8.9–10.3)
CO2: 28 mmol/L (ref 22–32)
CREATININE: 0.82 mg/dL (ref 0.61–1.24)
Chloride: 95 mmol/L — ABNORMAL LOW (ref 101–111)
GFR calc non Af Amer: >60 mL/min (ref >60)
Glucose, Bld: 116 mg/dL — ABNORMAL HIGH (ref 65–99)
Potassium: 4.7 mmol/L (ref 3.5–5.1)
Sodium: 132 mmol/L — ABNORMAL LOW (ref 135–145)

## 2014-08-09 LAB — LACTIC ACID, PLASMA: LACTIC ACID, VENOUS: 1.5 mmol/L (ref 0.5–2.0)

## 2014-08-09 LAB — CBC
HCT: 33.1 % — ABNORMAL LOW (ref 39.0–52.0)
Hemoglobin: 10.7 g/dL — ABNORMAL LOW (ref 13.0–17.0)
MCH: 26.8 pg (ref 26.0–34.0)
MCHC: 32.3 g/dL (ref 30.0–36.0)
MCV: 82.8 fL (ref 78.0–100.0)
Platelets: 254 10*3/uL (ref 150–400)
RBC: 4 MIL/uL — AB (ref 4.22–5.81)
RDW: 14.4 % (ref 11.5–15.5)
WBC: 10.2 10*3/uL (ref 4.0–10.5)

## 2014-08-09 MED ORDER — SORBITOL 70 % SOLN
960.0000 mL | TOPICAL_OIL | Freq: Once | ORAL | Status: DC
  Filled 2014-08-09: qty 240

## 2014-08-09 MED ORDER — HYDROMORPHONE HCL 1 MG/ML IJ SOLN
0.5000 mg | INTRAMUSCULAR | Status: DC | PRN
  Administered 2014-08-09: 0.5 mg via INTRAVENOUS
  Filled 2014-08-09: qty 1

## 2014-08-09 MED ORDER — HYDROMORPHONE HCL 1 MG/ML IJ SOLN
1.0000 mg | Freq: Once | INTRAMUSCULAR | Status: AC
  Administered 2014-08-09: 1 mg via INTRAVENOUS
  Filled 2014-08-09: qty 1

## 2014-08-09 NOTE — Progress Notes (Signed)
This RN received a call from [facility] that a bed is available tonight for transfer.  Report called to [place] at [facility]. Pt to be transferred to room C-702. [facility] notified of need for transport. Pt updated with transfer plan.

## 2014-08-09 NOTE — Progress Notes (Signed)
PATIENT DETAILS Name: Wayne Neal Age: 60 y.o. Sex: male Date of Birth: Feb 12, 1956 Admit Date: 08/07/2014 Admitting Physician Ivor Costa, MD TIR:[id number], MD  Subjective: Abd pain better with Dilaudid PCA-small BM yesterday. No vomiting. Tolerating PO intake  Assessment/Plan: Principal Problem: Abdominal pain:etiology likely new peritoneal mets/cancer burden. Abdomen is soft on exam. CT Abdomen-shows worsening lymphadenopathy and new peritoneal mets-but no new acute abnormalities. No fever, leukocytosis to suggest an infectious process. No vomiting-eating regular food.Continue IV Dilaudid PCA-have spoken with Oncology at [facility] [place] transfer. Patient and family agreeable.  Active Problems: Consitpation:continue Miralax, Senokot-repeat SMOG enema today.  Follow  Hx of Metastatic Renal Cell Carcinoma:s/p left nephrectomy on 5/24-he follows with Oncology at [facility]-will likely be transferred to [facility] today  HTN (hypertension):continue Metoprolol, Amlodipine and Avapro, follow BP trend  Dyslipidemia:cotninue statin  Gout:stable-no flare  GERD (gastroesophageal reflux disease):continue PPI  Disposition: Remain inpatient-transfer to [facility] when bed available  Antimicrobial agents  See below  Anti-infectives    None      DVT Prophylaxis: Prophylactic  Heparin  Code Status: Full code   Family Communication Spouse at bedside  Procedures: None  CONSULTS:  None  Time spent 25 minutes-Greater than 50% of this time was spent in counseling, explanation of diagnosis, planning of further management, and coordination of care.  MEDICATIONS: Scheduled Meds: . amLODipine  10 mg Oral Daily   And  . irbesartan  300 mg Oral Daily  . aspirin EC  325 mg Oral Daily  . atorvastatin  20 mg Oral Daily  . heparin  5,000 Units Subcutaneous 3 times per day  . HYDROmorphone PCA 0.3 mg/mL   Intravenous 6 times per day  . metoprolol tartrate  25 mg  Oral BID  . morphine  30 mg Oral Q12H  . multivitamin with minerals  1 tablet Oral Daily  . omega-3 acid ethyl esters  1 g Oral Daily  . pantoprazole  40 mg Oral Daily  . polyethylene glycol  17 g Oral BID  . senna-docusate  2 tablet Oral BID  . sorbitol, milk of mag, mineral oil, glycerin (SMOG) enema  960 mL Rectal Once   Continuous Infusions: . sodium chloride 10 mL/hr at 08/08/14 1157   PRN Meds:.alum & mag hydroxide-simeth, diphenhydrAMINE **OR** diphenhydrAMINE, naloxone **AND** sodium chloride, ondansetron **OR** ondansetron (ZOFRAN) IV    PHYSICAL EXAM: Vital signs in last 24 hours: Filed Vitals:   08/08/14 2108 08/09/14 0015 08/09/14 0344 08/09/14 0533  BP: 160/81   148/85  Pulse: 87   78  Temp: 98.5 F (36.9 C)   97.9 F (36.6 C)  TempSrc: Oral   Oral  Resp: 16 14 13 16   Height:      Weight:      SpO2: 96% 96% 96% 98%    Weight change:  Filed Weights   08/08/14 0614  Weight: 83.008 kg (183 lb)   Body mass index is 25.53 kg/(m^2).   Gen Exam: Awake and alert with clear speech.   Neck: Supple, No JVD.   Chest: B/L Clear.   CVS: S1 S2 Regular, no murmurs.  Abdomen: soft, BS +,mild tenderness in b/l flank area only, non distended.  Extremities: no edema, lower extremities warm to touch. Neurologic: Non Focal.   Skin: No Rash.   Wounds: N/A.    Intake/Output from previous day:  Intake/Output Summary (Last 24 hours) at 08/09/14 1004 Last data filed at 08/09/14 [phone]  Gross per 24 hour  Intake    590 ml  Output    575 ml  Net     15 ml     LAB RESULTS: CBC  Recent Labs Lab 08/07/14 2343 08/08/14 0535 08/09/14 0454  WBC 9.5 8.8 10.2  HGB 10.3* 9.8* 10.7*  HCT 33.0* 32.1* 33.1*  PLT 287 294 254  MCV 84.8 85.1 82.8  MCH 26.5 26.0 26.8  MCHC 31.2 30.5 32.3  RDW 14.5 14.6 14.4  LYMPHSABS 0.9  --   --   MONOABS 0.9  --   --   EOSABS 0.0  --   --   BASOSABS 0.0  --   --     Chemistries   Recent Labs Lab 08/07/14 2343 08/08/14 0535  08/09/14 0700  NA 130* 132* 132*  K 4.6 4.6 4.7  CL 97* 98* 95*  CO2 26 25 28   GLUCOSE 165* 140* 116*  BUN 17 16 17   CREATININE 0.99 0.87 0.82  CALCIUM 8.8* 9.0 9.1    CBG: No results for input(s): GLUCAP in the last 168 hours.  GFR Estimated Creatinine Clearance: 104.6 mL/min (by C-G formula based on Cr of 0.82).  Coagulation profile No results for input(s): INR, PROTIME in the last 168 hours.  Cardiac Enzymes No results for input(s): CKMB, TROPONINI, MYOGLOBIN in the last 168 hours.  Invalid input(s): CK  Invalid input(s): POCBNP No results for input(s): DDIMER in the last 72 hours. No results for input(s): HGBA1C in the last 72 hours. No results for input(s): CHOL, HDL, LDLCALC, TRIG, CHOLHDL, LDLDIRECT in the last 72 hours. No results for input(s): TSH, T4TOTAL, T3FREE, THYROIDAB in the last 72 hours.  Invalid input(s): FREET3 No results for input(s): VITAMINB12, FOLATE, FERRITIN, TIBC, IRON, RETICCTPCT in the last 72 hours.  Recent Labs  08/07/14 2343  LIPASE 15*    Urine Studies No results for input(s): UHGB, CRYS in the last 72 hours.  Invalid input(s): UACOL, UAPR, USPG, UPH, UTP, UGL, UKET, UBIL, UNIT, UROB, ULEU, UEPI, UWBC, URBC, UBAC, CAST, UCOM, BILUA  MICROBIOLOGY: No results found for this or any previous visit (from the past 240 hour(s)).  RADIOLOGY STUDIES/RESULTS: Ct Abdomen Pelvis Wo Contrast  08/08/2014   CLINICAL DATA:  Constipation for 2 days. History of stage IV metastatic renal cancer with increase in opiate medications recently. Abdominal pain.  EXAM: CT ABDOMEN AND PELVIS WITHOUT CONTRAST  TECHNIQUE: Multidetector CT imaging of the abdomen and pelvis was performed following the standard protocol without IV contrast.  COMPARISON:  06/30/2014  FINDINGS: Atelectasis in the lung bases. Pulmonary nodules in the left lung base, largest measuring 7 and 13 mm. These are likely metastatic. Small left pleural effusion.  Left kidney appears to been  resected. There is bulky lymphadenopathy along the celiac axis, retrocrural spaces, and throughout the retroperitoneum with periaortic and pericaval nodes present, measuring up to 8 cm diameter. This is consistent with known metastatic disease. Lymph nodes are measuring mildly larger than on prior study. There is no hydronephrosis or hydroureter on the right. No renal stones identified.  The unenhanced appearance the liver, spleen, gallbladder, pancreas, right adrenal gland, and inferior vena cava is unremarkable. Calcification of the abdominal aorta without aneurysm. Aorta, IVC, and pancreas are displaced by the retroperitoneal lymphadenopathy. Stomach, small bowel, and colon are not abnormally distended. No free air or free fluid in the abdomen. Nodular soft tissue implantation along the right upper quadrant omentum measuring 3.6 cm. This is likely due to peritoneal metastasis  and is new since previous study.  Pelvis: Prostate gland is not enlarged. Bladder wall is not thickened. Appendix is normal. Soft tissue mass in the anterior right lower quadrant measuring 2.8 cm may represent peritoneal or lymph node metastasis. This is enlarging since the previous study. No free or loculated pelvic fluid collections. Degenerative changes in the lumbar spine. No destructive bone lesions appreciated.  IMPRESSION: Surgical resection of the left kidney. Extensive metastatic disease throughout the retroperitoneal, celiac axis, and retrocrural lymph nodes with enlargement since previous study. New peritoneal implant in the right upper quadrant. Enlarging peritoneal implant or lymph node in the right lower quadrant. Nodules in the left lung base. No evidence of bowel obstruction or inflammation.   Electronically Signed   By: Lucienne Capers M.D.   On: 08/08/2014 01:49    Oren Binet, MD  [facility] Pager:[phone]  If 7PM-7AM, please contact night-coverage [email or web address] Password [email or web address] 08/09/2014, 10:04  AM   LOS: 1 day

## 2014-08-10 NOTE — Progress Notes (Signed)
[facility] arrived to unit. Pt in stable condition. Report given to [facility]. Pt to continue on dilaudid PCA with [facility]. Pt d/c'd to [facility] via [place]. Noreene Larsson RN, BSN

## 2014-08-10 NOTE — Progress Notes (Signed)
[facility] returned PCA to unit.  40ml of 0.3mg /ml of dilaudid PCA syringe wasted with Hortencia Conradi RN.

## 2014-08-27 ENCOUNTER — Other Ambulatory Visit: Payer: Self-pay | Admitting: Internal Medicine

## 2014-08-27 NOTE — Telephone Encounter (Signed)
REFILL 

## 2014-09-04 ENCOUNTER — Other Ambulatory Visit: Payer: Self-pay | Admitting: Internal Medicine

## 2014-09-04 NOTE — Telephone Encounter (Signed)
REFILL 

## 2014-11-02 ENCOUNTER — Other Ambulatory Visit: Payer: Self-pay | Admitting: Internal Medicine

## 2014-12-01 ENCOUNTER — Telehealth: Payer: Self-pay

## 2014-12-02 ENCOUNTER — Other Ambulatory Visit: Payer: Self-pay | Admitting: Internal Medicine

## 2014-12-02 NOTE — Telephone Encounter (Signed)
Rx(s) sent to pharmacy electronically.  

## 2014-12-10 ENCOUNTER — Ambulatory Visit: Payer: 59 | Admitting: Internal Medicine

## 2014-12-22 DEATH — deceased

## 2015-03-12 ENCOUNTER — Encounter: Payer: Self-pay | Admitting: Family Medicine

## 2015-03-17 ENCOUNTER — Encounter: Payer: Self-pay | Admitting: Family Medicine

## 2015-07-02 NOTE — Telephone Encounter (Signed)
close

## 2017-04-16 IMAGING — CT CT ABD-PELV W/ CM
2 of 11 series · 12 of 46 positions shown, 18 images · IV contrast (OMNIPAQUE)
Comparison: 06/29/2014 chest CT. No comparison CT scan of the
abdomen and pelvis.

CLINICAL DATA: 58-year-old male with abnormal chest CT raising
possibility of renal cell carcinoma. Subsequent encounter.

EXAM:
CT ABDOMEN AND PELVIS WITH CONTRAST
TECHNIQUE: Multidetector CT imaging of the abdomen and pelvis was performed
using the standard protocol following bolus administration of
intravenous contrast.
CONTRAST:  100mL OMNIPAQUE IOHEXOL 300 MG/ML  SOLN

[Series 4: venous · axial · portal-venous · 0.94mm/px · z∈[-588,-192]mm · 10 of 155 slices shown, 15 images]
[im 12/155  soft-tissue]
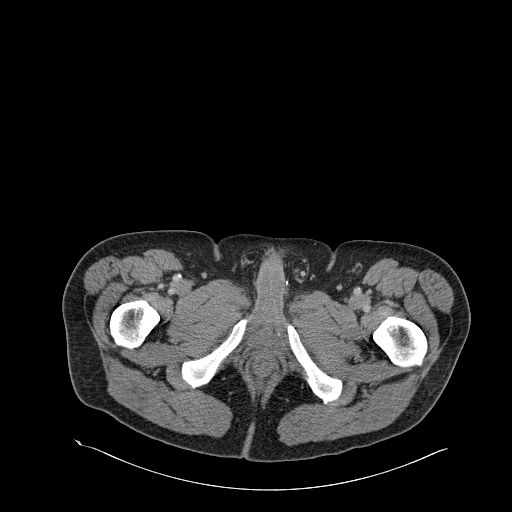
[im 12/155  bone]
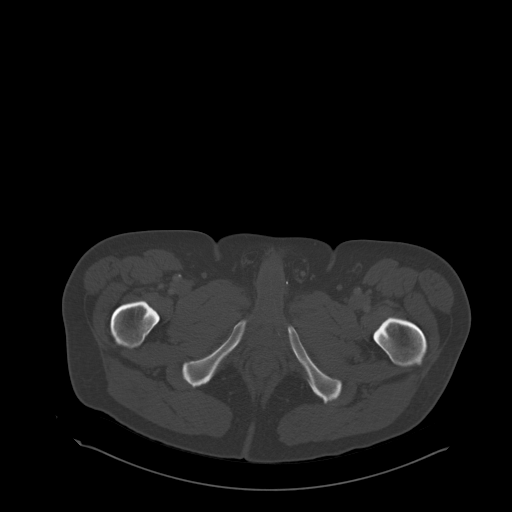
[im 34/155  soft-tissue]
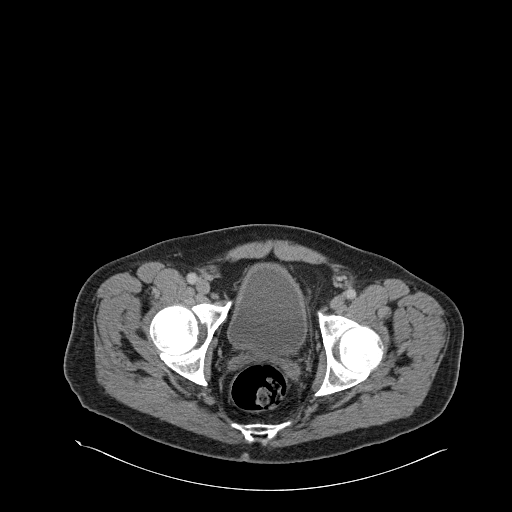
[im 45/155  soft-tissue]
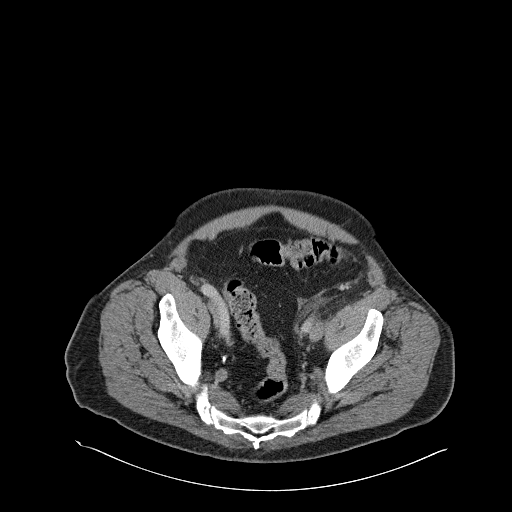
[im 67/155  soft-tissue]
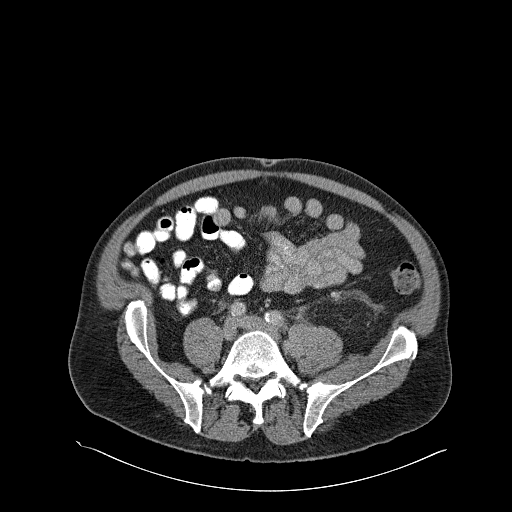
[im 78/155  soft-tissue]
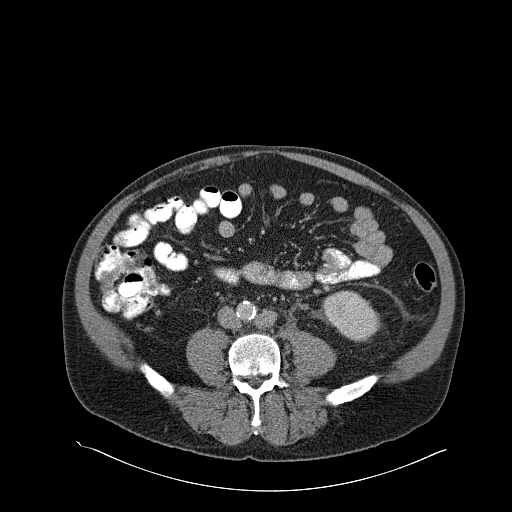
[im 89/155  soft-tissue]
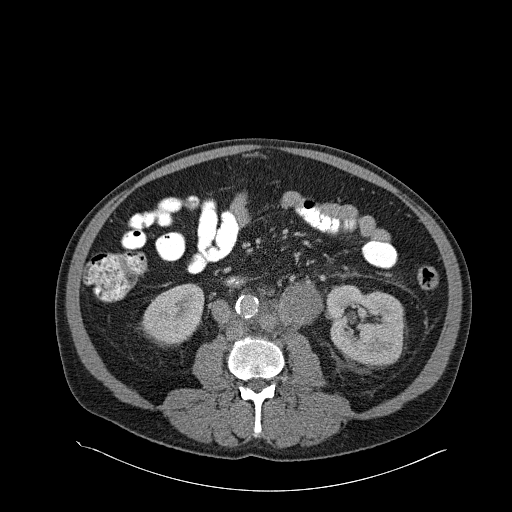
[im 111/155  soft-tissue]
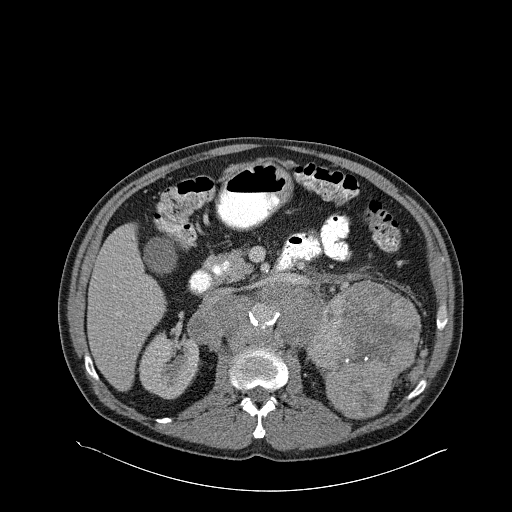
[im 111/155  lung]
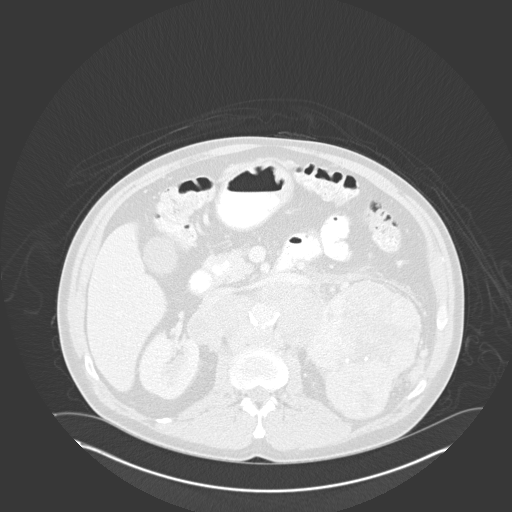
[im 122/155  soft-tissue]
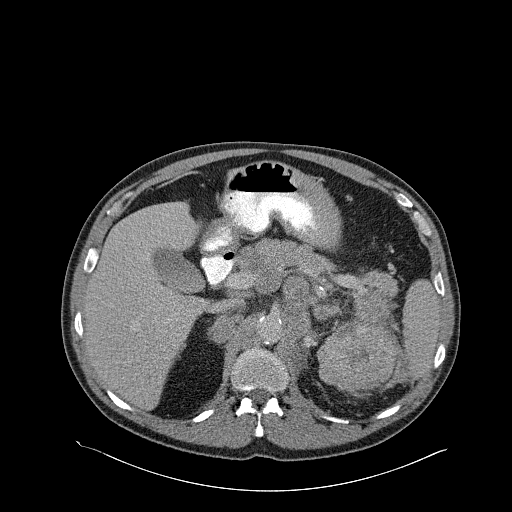
[im 122/155  lung]
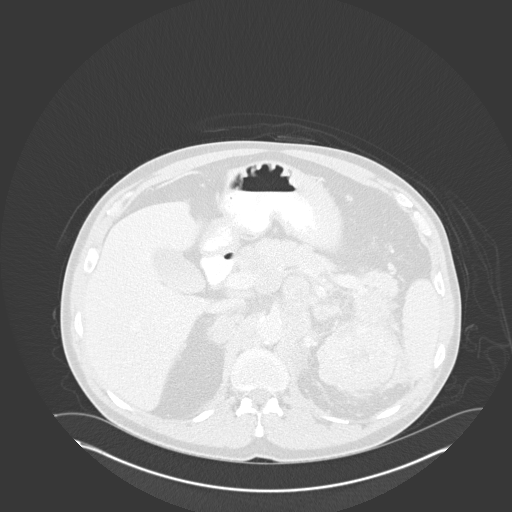
[im 133/155  lung]
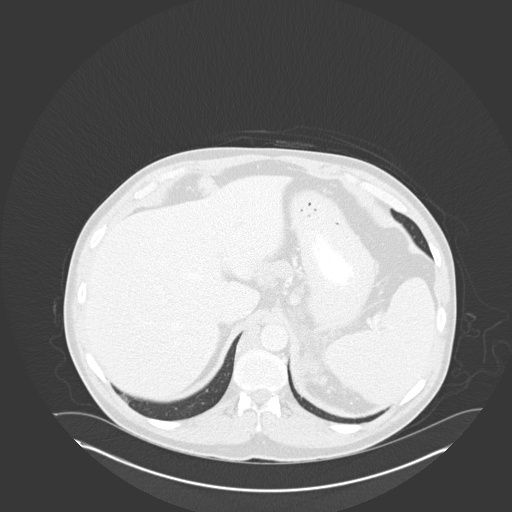
[im 144/155  soft-tissue]
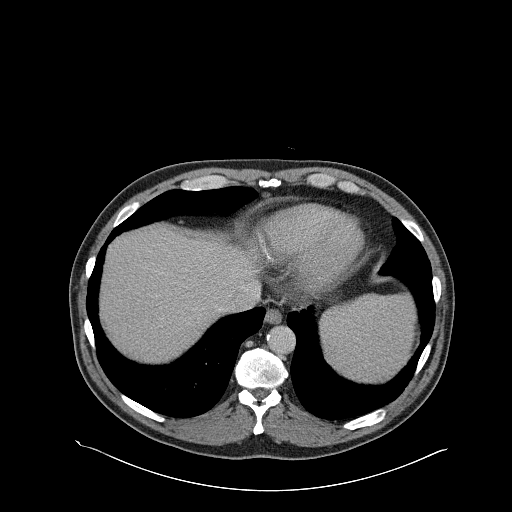
[im 144/155  lung]
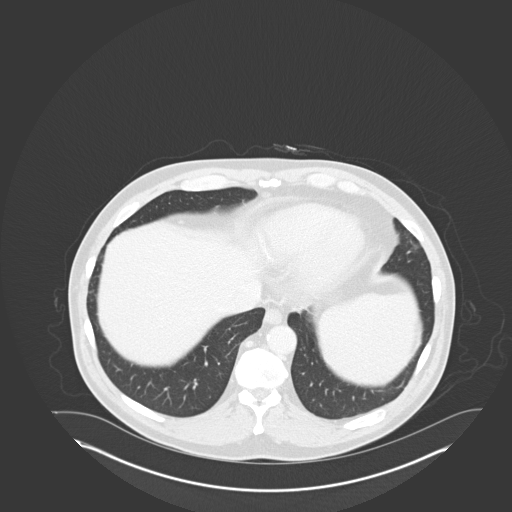
[im 144/155  bone]
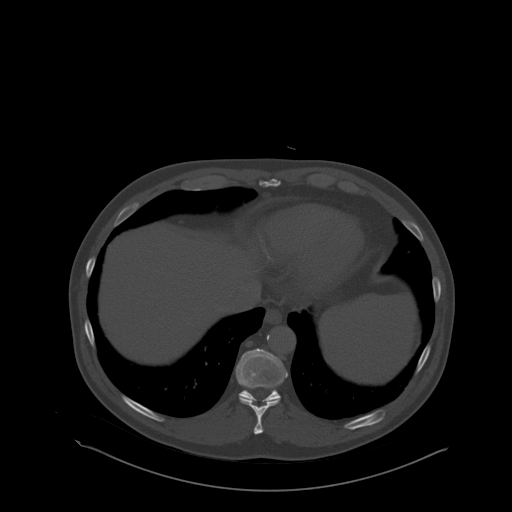

[Series 602: <mpr thick range> · coronal · 0.94mm/px · 2 of 106 slices shown, 3 images]
[im 36/106  soft-tissue]
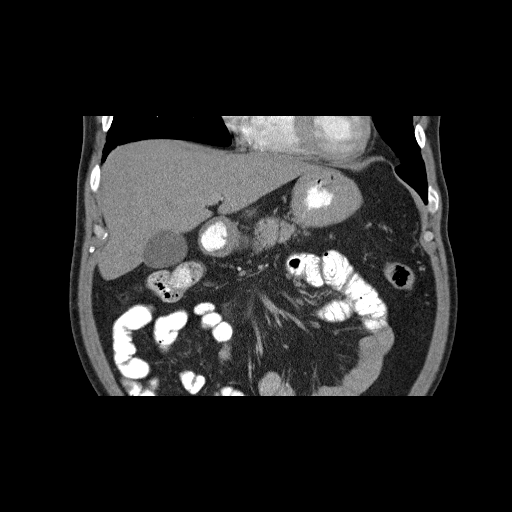
[im 36/106  bone]
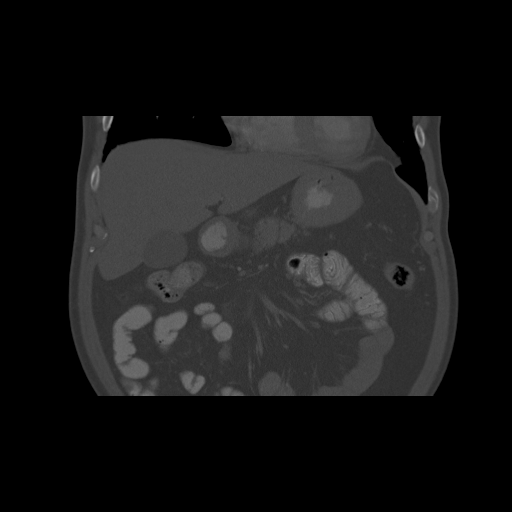
[im 71/106  soft-tissue]
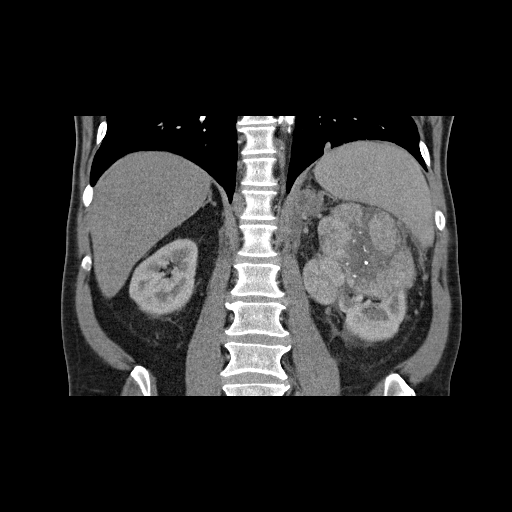

[12 of 46 positions shown; findings below may reference images not displayed]

FINDINGS: 11 x 10 x 12 cm bulky lobulated mass replaces majority of the mid to
superior aspect of the left kidney most consistent with renal cell
carcinoma.

Marked surrounding bulky adenopathy surrounds and envelops the
abdominal aorta and left renal artery. Adenopathy partially
surrounds and anteriorly displaces the right renal artery. This
bulky adenopathy causes anterior displacement and narrowing of the
left renal vein but without tumor thrombus detected. Bulky
adenopathy surrounds the celiac axis and peripancreatic region. Less
bulky adenopathy retrocrural and lower left para-aortic region.

Adenopathy versus low mental metastatic 2 cm lobulated mass anterior
to the liver. Small epicardial lymph nodes.

Lung base nodules largest left lung base measuring 1.2 x 0.9 cm
consistent with metastatic disease.

No osseous destructive lesion.

Focal fatty infiltration liver within the fissure for the ligamentum
teres without worrisome hepatic lesion. No focal splenic lesion. No
primary pancreatic lesion seen separate from bulky adenopathy. No
right renal lesion or right adrenal lesion. Left adrenal gland
surrounded by bulky adenopathy and evaluation limited.

No calcified gallstones.  Dense bile incidentally noted.

No primary bowel abnormality. Stomach under distended and evaluation
slightly limited.

Noncontrast filled views the urinary bladder without primary mass.
Prostate gland within the range of normal limits.

Atherosclerotic type changes coronary arteries, abdominal aorta
(with ectasia but without focal aneurysm) and involving abdominal
aortic branch vessels including iliac arteries and femoral arteries
IMPRESSION: 11 x 10 x 12 cm bulky lobulated mass replaces majority of the mid to
superior aspect of the left kidney most consistent with renal cell
carcinoma.

Marked surrounding bulky adenopathy surrounds and envelops the
abdominal aorta and left renal artery. Adenopathy partially
surrounds and anteriorly displaces the right renal artery. This
bulky adenopathy causes anterior displacement and narrowing of the
left renal vein but without tumor thrombus detected. Bulky
adenopathy surrounds the celiac axis and peripancreatic region. Less
bulky adenopathy retrocrural and lower left para-aortic region.

Adenopathy versus omental metastatic 2 cm lobulated mass anterior to
the liver. Small epicardial lymph nodes.

Lung base nodules largest left lung base measuring 1.2 x 0.9 cm
consistent with metastatic disease.

No osseous destructive lesion.

Left adrenal gland surrounded by bulky adenopathy and evaluation
limited

No right renal lesion or right adrenal lesion.
